# Patient Record
Sex: Male | Born: 1945
Health system: Southern US, Community
[De-identification: ages and names within clinical notes are randomized; demographics above are authoritative.]

## PROBLEM LIST (undated history)

## (undated) DIAGNOSIS — M1990 Unspecified osteoarthritis, unspecified site: Secondary | ICD-10-CM

## (undated) DIAGNOSIS — N471 Phimosis: Secondary | ICD-10-CM

## (undated) HISTORY — PX: APPENDECTOMY: SHX54

---

## 2001-11-28 ENCOUNTER — Emergency Department (HOSPITAL_COMMUNITY): Admission: EM | Admit: 2001-11-28 | Discharge: 2001-11-28 | Payer: Self-pay

## 2001-11-28 ENCOUNTER — Encounter: Payer: Self-pay | Admitting: Emergency Medicine

## 2001-12-31 ENCOUNTER — Emergency Department (HOSPITAL_COMMUNITY): Admission: EM | Admit: 2001-12-31 | Discharge: 2001-12-31 | Payer: Self-pay | Admitting: *Deleted

## 2005-12-26 ENCOUNTER — Ambulatory Visit: Payer: Self-pay | Admitting: Family Medicine

## 2006-01-14 ENCOUNTER — Ambulatory Visit (HOSPITAL_COMMUNITY): Admission: RE | Admit: 2006-01-14 | Discharge: 2006-01-14 | Payer: Self-pay | Admitting: Gastroenterology

## 2007-01-17 ENCOUNTER — Emergency Department (HOSPITAL_COMMUNITY): Admission: EM | Admit: 2007-01-17 | Discharge: 2007-01-17 | Payer: Self-pay | Admitting: Emergency Medicine

## 2015-04-13 ENCOUNTER — Ambulatory Visit: Payer: Self-pay | Admitting: General Surgery

## 2015-04-13 NOTE — H&P (Signed)
History of Present Illness Axel Filler(Jasiri Hanawalt MD; 04/13/2015 9:23 AM) The patient is a 69 year old male who presents with an inguinal hernia. The patient is a 69 year old male who is referred by Dr. Venetia NightAmao Patient comes in today with bilateral inguinal hernia. He states he is unsure of how long these hernias is been there. He does state he notices a bulge the left side however not the right side. He states the pain is worse after being on his feet throughout the day.  Physical Exam Axel Filler(Kasi Lasky MD; 04/13/2015 9:21 AM)  Loraine LericheMark as Reviewed General Mental Status-Alert. General Appearance-Consistent with stated age. Hydration-Well hydrated. Voice-Normal.  Head and Neck Head-normocephalic, atraumatic with no lesions or palpable masses. Trachea-midline. Thyroid Gland Characteristics - normal size and consistency.  Chest and Lung Exam Chest and lung exam reveals -quiet, even and easy respiratory effort with no use of accessory muscles and on auscultation, normal breath sounds, no adventitious sounds and normal vocal resonance. Inspection Chest Wall - Normal. Back - normal.  Cardiovascular Cardiovascular examination reveals -normal heart sounds, regular rate and rhythm with no murmurs and normal pedal pulses bilaterally.  Abdomen Inspection Skin - Scar - no surgical scars. Hernias - Inguinal hernia - Bilateral - Reducible. Palpation/Percussion Normal exam - Soft, Non Tender, No Rebound tenderness, No Rigidity (guarding) and No hepatosplenomegaly. Auscultation Normal exam - Bowel sounds normal.    Assessment & Plan Axel Filler(Anisah Kuck MD; 04/13/2015 9:24 AM)  BILATERAL INGUINAL HERNIA WITHOUT OBSTRUCTION OR GANGRENE, RECURRENCE NOT SPECIFIED (550.92  K40.20) Impression: 69 year old male with bilateral inguinal hernias left greater than right. Patient does have a history of a open appendectomy.  1. The patient will like to proceed to plan for laparoscopic bilateral inguinal  hernia repair with mesh. 2. All risks and benefits were discussed with the patient to generally include, but not limited to: infection, bleeding, damage to surrounding structures, acute and chronic nerve pain, and recurrence. Alternatives were offered and described. All questions were answered and the patient voiced understanding of the procedure and wishes to proceed at this point with hernia repair.

## 2015-05-02 ENCOUNTER — Encounter (HOSPITAL_COMMUNITY): Payer: Self-pay

## 2015-05-02 ENCOUNTER — Encounter (HOSPITAL_COMMUNITY)
Admission: RE | Admit: 2015-05-02 | Discharge: 2015-05-02 | Disposition: A | Discharge status: Home / Self Care | Payer: Medicare Other | Source: Ambulatory Visit | Attending: General Surgery | Admitting: General Surgery

## 2015-05-02 DIAGNOSIS — K4020 Bilateral inguinal hernia, without obstruction or gangrene, not specified as recurrent: Secondary | ICD-10-CM | POA: Diagnosis present

## 2015-05-02 LAB — CBC
HCT: 42.5 % (ref 39.0–52.0)
Hemoglobin: 14.4 g/dL (ref 13.0–17.0)
MCH: 30.1 pg (ref 26.0–34.0)
MCHC: 33.9 g/dL (ref 30.0–36.0)
MCV: 88.9 fL (ref 78.0–100.0)
PLATELETS: 160 10*3/uL (ref 150–400)
RBC: 4.78 MIL/uL (ref 4.22–5.81)
RDW: 12.9 % (ref 11.5–15.5)
WBC: 6.6 10*3/uL (ref 4.0–10.5)

## 2015-05-02 LAB — BASIC METABOLIC PANEL
Anion gap: 8 (ref 5–15)
BUN: 14 mg/dL (ref 6–20)
CALCIUM: 8.8 mg/dL — AB (ref 8.9–10.3)
CHLORIDE: 106 mmol/L (ref 101–111)
CO2: 29 mmol/L (ref 22–32)
CREATININE: 0.71 mg/dL (ref 0.61–1.24)
GFR calc Af Amer: >60 mL/min (ref >60)
GFR calc non Af Amer: >60 mL/min (ref >60)
GLUCOSE: 96 mg/dL (ref 65–99)
Potassium: 4.7 mmol/L (ref 3.5–5.1)
Sodium: 143 mmol/L (ref 135–145)

## 2015-05-02 NOTE — Patient Instructions (Signed)
Jerry [name]  05/02/2015   Your procedure is scheduled on: Friday May 05, 2015   Report to [facility] Main  Entrance and follow signs to               Short Stay Center arrive at 10:30 AM.  Call this number if you have problems the morning of surgery [phone]   Remember: ONLY 1 PERSON MAY GO WITH YOU TO SHORT STAY TO GET  READY MORNING OF YOUR SURGERY.  Do not eat food After Midnight but may take clear liquids till 6:30 am day of surgery then nothing by mouth.      Take these medicines the morning of surgery with A SIP OF WATER: NONE                               You may not have any metal on your body including hair pins and              piercings  Do not wear jewelry, colognes, lotions, powders or deodorant                           Men may shave face and neck.   Do not bring valuables to the hospital.  IS NOT             RESPONSIBLE   FOR VALUABLES.  Contacts, dentures or bridgework may not be worn into surgery.       Patients discharged the day of surgery will not be allowed to drive home.  Name and phone number of your driver:Jerry [name] (sister)   _____________________________________________________________________             [facility] - Preparing for Surgery Before surgery, you can play an important role.  Because skin is not sterile, your skin needs to be as free of germs as possible.  You can reduce the number of germs on your skin by washing with CHG (chlorahexidine gluconate) soap before surgery.  CHG is an antiseptic cleaner which kills germs and bonds with the skin to continue killing germs even after washing. Please DO NOT use if you have an allergy to CHG or antibacterial soaps.  If your skin becomes reddened/irritated stop using the CHG and inform your nurse when you arrive at Short Stay. Do not shave (including legs and underarms) for at least 48 hours prior to the first CHG shower.  You may shave your  face/neck. Please follow these instructions carefully:  1.  Shower with CHG Soap the night before surgery and the  morning of Surgery.  2.  If you choose to wash your hair, wash your hair first as usual with your  normal  shampoo.  3.  After you shampoo, rinse your hair and body thoroughly to remove the  shampoo.                           4.  Use CHG as you would any other liquid soap.  You can apply chg directly  to the skin and wash                       Gently with a scrungie or clean washcloth.  5.  Apply the CHG Soap to your body  ONLY FROM THE NECK DOWN.   Do not use on face/ open                           Wound or open sores. Avoid contact with eyes, ears mouth and genitals (private parts).                       Wash face,  Genitals (private parts) with your normal soap.             6.  Wash thoroughly, paying special attention to the area where your surgery  will be performed.  7.  Thoroughly rinse your body with warm water from the neck down.  8.  DO NOT shower/wash with your normal soap after using and rinsing off  the CHG Soap.                9.  Pat yourself dry with a clean towel.            10.  Wear clean pajamas.            11.  Place clean sheets on your bed the night of your first shower and do not  sleep with pets. Day of Surgery : Do not apply any lotions/deodorants the morning of surgery.  Please wear clean clothes to the hospital/surgery center.  FAILURE TO FOLLOW THESE INSTRUCTIONS MAY RESULT IN THE CANCELLATION OF YOUR SURGERY PATIENT SIGNATURE_________________________________  NURSE SIGNATURE__________________________________  ________________________________________________________________________    CLEAR LIQUID DIET   Foods Allowed                                                                     Foods Excluded  Coffee and tea, regular and decaf                             liquids that you cannot  Plain Jell-O in any flavor                                              see through such as: Fruit ices (not with fruit pulp)                                     milk, soups, orange juice  Iced Popsicles                                    All solid food Carbonated beverages, regular and diet                                    Cranberry, grape and apple juices Sports drinks like Gatorade Lightly seasoned clear broth or consume(fat free) Sugar, honey syrup  Sample Menu Breakfast  Lunch                                     Supper Cranberry juice                    Beef broth                            Chicken broth Jell-O                                     Grape juice                           Apple juice Coffee or tea                        Jell-O                                      Popsicle                                                Coffee or tea                        Coffee or tea  _____________________________________________________________________

## 2015-05-05 ENCOUNTER — Ambulatory Visit (HOSPITAL_COMMUNITY): Payer: Medicare Other | Admitting: Anesthesiology

## 2015-05-05 ENCOUNTER — Ambulatory Visit (HOSPITAL_COMMUNITY)
Admission: RE | Admit: 2015-05-05 | Discharge: 2015-05-05 | Disposition: A | Discharge status: Home / Self Care | Payer: Medicare Other | Source: Ambulatory Visit | Attending: General Surgery | Admitting: General Surgery

## 2015-05-05 ENCOUNTER — Encounter (HOSPITAL_COMMUNITY)
Admission: RE | Disposition: A | Discharge status: Home / Self Care | Payer: Self-pay | Source: Ambulatory Visit | Attending: General Surgery

## 2015-05-05 ENCOUNTER — Encounter (HOSPITAL_COMMUNITY): Payer: Self-pay | Admitting: *Deleted

## 2015-05-05 DIAGNOSIS — K4020 Bilateral inguinal hernia, without obstruction or gangrene, not specified as recurrent: Secondary | ICD-10-CM | POA: Insufficient documentation

## 2015-05-05 HISTORY — PX: INGUINAL HERNIA REPAIR: SHX194

## 2015-05-05 HISTORY — PX: INSERTION OF MESH: SHX5868

## 2015-05-05 SURGERY — REPAIR, HERNIA, INGUINAL, LAPAROSCOPIC
Anesthesia: General | Laterality: Bilateral

## 2015-05-05 MED ORDER — OXYCODONE-ACETAMINOPHEN 5-325 MG PO TABS: 1.0000 | ORAL_TABLET | ORAL | Status: DC | PRN

## 2015-05-05 MED ORDER — STERILE WATER FOR IRRIGATION IR SOLN
Status: DC | PRN
  Administered 2015-05-05: 150 mL via INTRAVESICAL

## 2015-05-05 MED ORDER — CEFAZOLIN SODIUM-DEXTROSE 2-3 GM-% IV SOLR
INTRAVENOUS | Status: AC
  Filled 2015-05-05: qty 50

## 2015-05-05 MED ORDER — LIDOCAINE HCL (CARDIAC) 20 MG/ML IV SOLN
INTRAVENOUS | Status: DC | PRN
  Administered 2015-05-05: 30 mg via INTRAVENOUS

## 2015-05-05 MED ORDER — ACETAMINOPHEN 325 MG PO TABS
650.0000 mg | ORAL_TABLET | ORAL | Status: DC | PRN
Start: 2015-05-05 — End: 2015-05-05
  Administered 2015-05-05: 650 mg via ORAL
  Filled 2015-05-05: qty 2

## 2015-05-05 MED ORDER — CEFAZOLIN SODIUM-DEXTROSE 2-3 GM-% IV SOLR
2.0000 g | INTRAVENOUS | Status: AC
  Administered 2015-05-05: 2 g via INTRAVENOUS

## 2015-05-05 MED ORDER — OXYCODONE HCL 5 MG PO TABS: 5.0000 mg | ORAL_TABLET | ORAL | Status: DC | PRN

## 2015-05-05 MED ORDER — NEOSTIGMINE METHYLSULFATE 10 MG/10ML IV SOLN
INTRAVENOUS | Status: DC | PRN
  Administered 2015-05-05: 2.5 mg via INTRAVENOUS

## 2015-05-05 MED ORDER — GLYCOPYRROLATE 0.2 MG/ML IJ SOLN
INTRAMUSCULAR | Status: DC | PRN
  Administered 2015-05-05: .5 mg via INTRAVENOUS

## 2015-05-05 MED ORDER — ACETAMINOPHEN 650 MG RE SUPP
650.0000 mg | RECTAL | Status: DC | PRN
  Filled 2015-05-05: qty 1

## 2015-05-05 MED ORDER — GLYCOPYRROLATE 0.2 MG/ML IJ SOLN
INTRAMUSCULAR | Status: DC | PRN
Start: 2015-05-05 — End: 2015-05-05

## 2015-05-05 MED ORDER — ROCURONIUM BROMIDE 100 MG/10ML IV SOLN
INTRAVENOUS | Status: DC | PRN
  Administered 2015-05-05: 30 mg via INTRAVENOUS

## 2015-05-05 MED ORDER — ONDANSETRON HCL 4 MG/2ML IJ SOLN
INTRAMUSCULAR | Status: DC | PRN
  Administered 2015-05-05: 4 mg via INTRAVENOUS

## 2015-05-05 MED ORDER — BUPIVACAINE-EPINEPHRINE 0.25% -1:200000 IJ SOLN
INTRAMUSCULAR | Status: DC | PRN
  Administered 2015-05-05: 7 mL

## 2015-05-05 MED ORDER — CHLORHEXIDINE GLUCONATE 4 % EX LIQD: 1.0000 "application " | Freq: Once | CUTANEOUS | Status: DC

## 2015-05-05 MED ORDER — ONDANSETRON HCL 4 MG/2ML IJ SOLN
INTRAMUSCULAR | Status: AC
  Filled 2015-05-05: qty 2

## 2015-05-05 MED ORDER — GLYCOPYRROLATE 0.2 MG/ML IJ SOLN
INTRAMUSCULAR | Status: AC
  Filled 2015-05-05: qty 3

## 2015-05-05 MED ORDER — FENTANYL CITRATE (PF) 250 MCG/5ML IJ SOLN
INTRAMUSCULAR | Status: DC | PRN
  Administered 2015-05-05: 50 ug via INTRAVENOUS
  Administered 2015-05-05: 100 ug via INTRAVENOUS
  Administered 2015-05-05 (×2): 50 ug via INTRAVENOUS

## 2015-05-05 MED ORDER — BUPIVACAINE-EPINEPHRINE (PF) 0.25% -1:200000 IJ SOLN
INTRAMUSCULAR | Status: AC
  Filled 2015-05-05: qty 30

## 2015-05-05 MED ORDER — ROCURONIUM BROMIDE 100 MG/10ML IV SOLN
INTRAVENOUS | Status: AC
Start: 2015-05-05 — End: 2015-05-05
  Filled 2015-05-05: qty 1

## 2015-05-05 MED ORDER — PROPOFOL 10 MG/ML IV BOLUS
INTRAVENOUS | Status: DC | PRN
  Administered 2015-05-05: 150 mg via INTRAVENOUS

## 2015-05-05 MED ORDER — LACTATED RINGERS IV SOLN
INTRAVENOUS | Status: DC
  Administered 2015-05-05: 1000 mL via INTRAVENOUS
  Administered 2015-05-05: 14:00:00 via INTRAVENOUS

## 2015-05-05 MED ORDER — FENTANYL CITRATE (PF) 250 MCG/5ML IJ SOLN
INTRAMUSCULAR | Status: AC
  Filled 2015-05-05: qty 5

## 2015-05-05 MED ORDER — PROPOFOL 10 MG/ML IV BOLUS
INTRAVENOUS | Status: AC
  Filled 2015-05-05: qty 20

## 2015-05-05 MED ORDER — LIDOCAINE HCL (CARDIAC) 20 MG/ML IV SOLN
INTRAVENOUS | Status: AC
  Filled 2015-05-05: qty 5

## 2015-05-05 MED ORDER — NEOSTIGMINE METHYLSULFATE 10 MG/10ML IV SOLN
INTRAVENOUS | Status: AC
  Filled 2015-05-05: qty 1

## 2015-05-05 MED ORDER — SUCCINYLCHOLINE CHLORIDE 20 MG/ML IJ SOLN
INTRAMUSCULAR | Status: DC | PRN
  Administered 2015-05-05: 100 mg via INTRAVENOUS

## 2015-05-05 SURGICAL SUPPLY — 34 items
BAG URINE DRAINAGE (UROLOGICAL SUPPLIES) ×3 IMPLANT
BENZOIN TINCTURE PRP APPL 2/3 (GAUZE/BANDAGES/DRESSINGS) ×3 IMPLANT
CABLE HIGH FREQUENCY MONO STRZ (ELECTRODE) ×3 IMPLANT
CATH FOLEY 3WAY 30CC 16FR (CATHETERS) ×3 IMPLANT
CHLORAPREP W/TINT 26ML (MISCELLANEOUS) ×3 IMPLANT
CLOSURE WOUND 1/2 X4 (GAUZE/BANDAGES/DRESSINGS) ×1
DECANTER SPIKE VIAL GLASS SM (MISCELLANEOUS) IMPLANT
DRAPE LAPAROSCOPIC ABDOMINAL (DRAPES) ×3 IMPLANT
ELECT REM PT RETURN 9FT ADLT (ELECTROSURGICAL) ×3
ELECTRODE REM PT RTRN 9FT ADLT (ELECTROSURGICAL) ×1 IMPLANT
ENDOLOOP SUT PDS II  0 18 (SUTURE) ×2
ENDOLOOP SUT PDS II 0 18 (SUTURE) ×1 IMPLANT
GLOVE BIO SURGEON STRL SZ7.5 (GLOVE) ×3 IMPLANT
GOWN STRL REUS W/TWL XL LVL3 (GOWN DISPOSABLE) ×9 IMPLANT
KIT BASIN OR (CUSTOM PROCEDURE TRAY) ×3 IMPLANT
MESH 3DMAX 4X6 LT LRG (Mesh General) ×3 IMPLANT
MESH 3DMAX 4X6 RT LRG (Mesh General) ×3 IMPLANT
NEEDLE INSUFFLATION 14GA 120MM (NEEDLE) ×3 IMPLANT
PLUG CATH AND CAP STER (CATHETERS) ×3 IMPLANT
RELOAD STAPLE HERNIA 4.0 BLUE (INSTRUMENTS) ×3 IMPLANT
RELOAD STAPLE HERNIA 4.8 BLK (STAPLE) IMPLANT
SCISSORS LAP 5X35 DISP (ENDOMECHANICALS) ×3 IMPLANT
SET IRRIG TUBING LAPAROSCOPIC (IRRIGATION / IRRIGATOR) IMPLANT
SET IRRIG Y TYPE TUR BLADDER L (SET/KITS/TRAYS/PACK) ×3 IMPLANT
STAPLER HERNIA 12 8.5 360D (INSTRUMENTS) ×3 IMPLANT
STRIP CLOSURE SKIN 1/2X4 (GAUZE/BANDAGES/DRESSINGS) ×2 IMPLANT
SUT MNCRL AB 4-0 PS2 18 (SUTURE) ×3 IMPLANT
TAPE CLOTH SURG 4X10 WHT LF (GAUZE/BANDAGES/DRESSINGS) ×3 IMPLANT
TOWEL OR 17X26 10 PK STRL BLUE (TOWEL DISPOSABLE) ×3 IMPLANT
TOWEL OR NON WOVEN STRL DISP B (DISPOSABLE) ×3 IMPLANT
TRAY FOLEY W/METER SILVER 14FR (SET/KITS/TRAYS/PACK) IMPLANT
TRAY LAPAROSCOPIC (CUSTOM PROCEDURE TRAY) ×3 IMPLANT
TROCAR CANNULA W/PORT DUAL 5MM (MISCELLANEOUS) ×3 IMPLANT
TROCAR XCEL 12X100 BLDLESS (ENDOMECHANICALS) ×3 IMPLANT

## 2015-05-05 NOTE — Anesthesia Preprocedure Evaluation (Signed)
Anesthesia Evaluation  Patient identified by MRN, date of birth, ID band Patient awake    Reviewed: Allergy & Precautions, NPO status   History of Anesthesia Complications Negative for: history of anesthetic complications  Airway Mallampati: I  TM Distance: >3 FB Neck ROM: Full    Dental  (+) Edentulous Upper   Pulmonary shortness of breath,  breath sounds clear to auscultation        Cardiovascular negative cardio ROS  Rhythm:Regular Rate:Normal     Neuro/Psych    GI/Hepatic negative GI ROS, Neg liver ROS,   Endo/Other  negative endocrine ROS  Renal/GU negative Renal ROS     Musculoskeletal negative musculoskeletal ROS (+)   Abdominal   Peds  Hematology   Anesthesia Other Findings   Reproductive/Obstetrics                             Anesthesia Physical Anesthesia Plan  ASA: I  Anesthesia Plan: General   Post-op Pain Management:    Induction: Intravenous  Airway Management Planned: Oral ETT  Additional Equipment:   Intra-op Plan:   Post-operative Plan: Extubation in OR  Informed Consent: I have reviewed the patients History and Physical, chart, labs and discussed the procedure including the risks, benefits and alternatives for the proposed anesthesia with the patient or authorized representative who has indicated his/her understanding and acceptance.   Dental advisory given  Plan Discussed with:   Anesthesia Plan Comments:         Anesthesia Quick Evaluation

## 2015-05-05 NOTE — Op Note (Signed)
05/05/2015  1:39 PM  PATIENT:  Jerry [name]  69 y.o. male  PRE-OPERATIVE DIAGNOSIS:  BILATERAL INGUINAL HERNIAS  POST-OPERATIVE DIAGNOSIS:  BILATERAL INGUINAL HERNIAS  PROCEDURE:  Procedure(s): LAPAROSCOPIC BILATERAL INGUINAL HERNIA REPAIRS WITH MESH (Bilateral) INSERTION OF MESH (Bilateral) BILATERAL DIRECTS  SURGEON:  Surgeon(s) and Role:    * Axel Filler, MD - Primary  ANESTHESIA:   local and general  EBL: 5cc Total I/O In: 1000 [I.V.:1000] Out: 110 [Urine:100; Blood:10]  BLOOD ADMINISTERED:none  DRAINS: none   LOCAL MEDICATIONS USED:  BUPIVICAINE   SPECIMEN:  No Specimen  DISPOSITION OF SPECIMEN:  N/A  COUNTS:  YES  TOURNIQUET:  * No tourniquets in log *  DICTATION: .Dragon Dictation Details of the procedure: The patient was taken back to the operating room. The patient was placed in supine position with bilateral SCDs in place.  The patient was prepped and draped in the usual sterile fashion.  After appropriate anitbiotics were confirmed, a time-out was confirmed and all facts were verified.  0.25% Marcaine was used to infiltrate the umbilical area. A 11-blade was used to cut down the skin and blunt dissection was used to get the anterior fashion.  The anterior fascia was incised approximately 1 cm and the muscles were retracted laterally. Blunt dissection was then used to create a space in the preperitoneal area on the right side. At this time a 10 mm camera was then introduced into the space and advanced the pubic tubercle and a 12 mm trocar was placed over this and insufflation was started.    At this time and space was created from medial to laterally the preperitoneal space on the right side.  Cooper's ligament was initially cleaned off.  The hernia sac was identified in the left indirect space. Dissection of the hernia sac was undertaken the vas deferens was identified and protected in all parts of the case.  There was a small tear into the hernia sac. A  Veress needle right upper quadrant to help evacuate the intraperitoneal air.  Once the hernia sac was taken down to approximately the umbilicus, and the cord lipoma reduced, a Bard 3D Max mesh was  introduced into the preperitoneal space.  The mesh was brought over the direct and indirect hernia spaces.  This was anchored into place and secured to Cooper's ligament with 4.70mm staples from a Coviden hernia stapler. It was anchored to the anterior abdominal wall with 4.8 mm staples. The hernia sac was seen lying posterior to the mesh. There was no staples placed laterally.   The exact same dissection took place on the right side.  There was a small indirect hernia and also weakness of the direct space.  The Bard 3D max mesh was placed to cover both the direct and indirect spaces.  This was anchored into place and secured to Cooper's ligament with 4.60mm staples from a Coviden hernia stapler. It was anchored to the anterior abdominal wall with 4.8 mm staples. The hernia sac was seen lying posterior to the mesh. There was no staples placed laterally.   The insufflation was evacuated. The trochars were removed. The anterior fascia was reapproximated using #1 Vicryl on a UR- 6.  Intra-abdominal air was evacuated and the Veress needle removed. The skin was reapproximated using 4-0 Monocryl subcuticular fashion the patient was awakened from general anesthesia and taken to recovery in stable condition.   PLAN OF CARE: Discharge to home after PACU  PATIENT DISPOSITION:  PACU - hemodynamically stable.   Delay  start of Pharmacological VTE agent (>24hrs) due to surgical blood loss or risk of bleeding: not applicable

## 2015-05-05 NOTE — Anesthesia Procedure Notes (Signed)
Procedure Name: Intubation Date/Time: 05/05/2015 12:32 PM Performed by: Izora Gala A Pre-anesthesia Checklist: Emergency Drugs available, Patient identified, Timeout performed, Suction available and Patient being monitored Patient Re-evaluated:Patient Re-evaluated prior to inductionOxygen Delivery Method: Circle system utilized Preoxygenation: Pre-oxygenation with 100% oxygen Ventilation: Mask ventilation without difficulty Laryngoscope Size: Mac and 3 Grade View: Grade I Tube type: Oral Tube size: 7.5 mm Number of attempts: 2 Airway Equipment and Method: Stylet Placement Confirmation: ETT inserted through vocal cords under direct vision,  breath sounds checked- equal and bilateral and positive ETCO2 Secured at: 21 cm Tube secured with: Tape Dental Injury: Teeth and Oropharynx as per pre-operative assessment

## 2015-05-05 NOTE — Discharge Instructions (Signed)
[facility] _______[facility] ° °INGUINAL HERNIA REPAIR: POST OP INSTRUCTIONS ° °Always review your discharge instruction sheet given to you by the facility where your surgery was performed. °IF YOU HAVE DISABILITY OR FAMILY LEAVE FORMS, YOU MUST BRING THEM TO THE OFFICE FOR PROCESSING.   °DO NOT GIVE THEM TO YOUR DOCTOR. ° °1. A  prescription for pain medication may be given to you upon discharge.  Take your pain medication as prescribed, if needed.  If narcotic pain medicine is not needed, then you may take acetaminophen (Tylenol) or ibuprofen (Advil) as needed. °2. Take your usually prescribed medications unless otherwise directed. °3. If you need a refill on your pain medication, please contact your pharmacy.  They will contact our office to request authorization. Prescriptions will not be filled after 5 pm or on week-ends. °4. You should follow a light diet the first 24 hours after arrival home, such as soup and crackers, etc.  Be sure to include lots of fluids daily.  Resume your normal diet the day after surgery. °5. Most patients will experience some swelling and bruising around the umbilicus or in the groin and scrotum.  Ice packs and reclining will help.  Swelling and bruising can take several days to resolve.  °6. It is common to experience some constipation if taking pain medication after surgery.  Increasing fluid intake and taking a stool softener (such as Colace) will usually help or prevent this problem from occurring.  A mild laxative (Milk of Magnesia or Miralax) should be taken according to package directions if there are no bowel movements after 48 hours. °7. Unless discharge instructions indicate otherwise, you may remove your bandages 24-48 hours after surgery, and you may shower at that time.  You may have steri-strips (small skin tapes) in place directly over the incision.  These strips should be left on the skin for 7-10 days.  If your surgeon used skin glue on the incision, you  may shower in 24 hours.  The glue will flake off over the next 2-3 weeks.  Any sutures or staples will be removed at the office during your follow-up visit. °8. ACTIVITIES:  You may resume regular (light) daily activities beginning the next day--such as daily self-care, walking, climbing stairs--gradually increasing activities as tolerated.  You may have sexual intercourse when it is comfortable.  Refrain from any heavy lifting or straining until approved by your doctor. °a. You may drive when you are no longer taking prescription pain medication, you can comfortably wear a seatbelt, and you can safely maneuver your car and apply brakes. °b. RETURN TO WORK:  __________________________________________________________ °9. You should see your doctor in the office for a follow-up appointment approximately 2-3 weeks after your surgery.  Make sure that you call for this appointment within a day or two after you arrive home to insure a convenient appointment time. °10. OTHER INSTRUCTIONS:  __________________________________________________________________________________________________________________________________________________________________________________________  °WHEN TO CALL YOUR DOCTOR: °1. Fever over 101.0 °2. Inability to urinate °3. Nausea and/or vomiting °4. Extreme swelling or bruising °5. Continued bleeding from incision. °6. Increased pain, redness, or drainage from the incision ° °The clinic staff is available to answer your questions during regular business hours.  Please don’t hesitate to call and ask to speak to one of the nurses for clinical concerns.  If you have a medical emergency, go to the nearest emergency room or call 911.  A surgeon from [facility] is always on call at the hospital ° ° °[address], [address], [place], [place]  [address] ? ° [address] [address], [place], [place]   [address] °[phone] ? [phone] ? FAX [phone] °Web site:  [email or web address] ° °

## 2015-05-05 NOTE — Interval H&P Note (Signed)
History and Physical Interval Note:  05/05/2015 10:29 AM  Jerry [name]  has presented today for surgery, with the diagnosis of BILATERAL INGUINAL HERNIAS  The various methods of treatment have been discussed with the patient and family. After consideration of risks, benefits and other options for treatment, the patient has consented to  Procedure(s): LAPAROSCOPIC BILATERAL INGUINAL HERNIA REPAIRS WITH MESH (Bilateral) INSERTION OF MESH (Bilateral) as a surgical intervention .  The patient's history has been reviewed, patient examined, no change in status, stable for surgery.  I have reviewed the patient's chart and labs.  Questions were answered to the patient's satisfaction.     Marigene Ehlers., Jed Limerick

## 2015-05-05 NOTE — Transfer of Care (Signed)
Immediate Anesthesia Transfer of Care Note  Patient: Jerry [name]  Procedure(s) Performed: Procedure(s): LAPAROSCOPIC BILATERAL INGUINAL HERNIA REPAIRS WITH MESH (Bilateral) INSERTION OF MESH (Bilateral)  Patient Location: PACU  Anesthesia Type:General  Level of Consciousness: awake, alert  and oriented  Airway & Oxygen Therapy: Patient Spontanous Breathing and Patient connected to face mask oxygen  Post-op Assessment: Report given to RN and Post -op Vital signs reviewed and stable  Post vital signs: Reviewed and stable  Last Vitals:  Filed Vitals:   05/05/15 1020  BP: 145/85  Pulse: 70  Temp: 36.6 C  Resp: 18    Complications: No apparent anesthesia complications

## 2015-05-05 NOTE — H&P (View-Only) (Signed)
History of Present Illness Jerry Filler MD; 04/13/2015 9:23 AM) The patient is a 69 year old male who presents with an inguinal hernia. The patient is a 70 year old male who is referred by Dr. Venetia Night Patient comes in today with bilateral inguinal hernia. He states he is unsure of how long these hernias is been there. He does state he notices a bulge the left side however not the right side. He states the pain is worse after being on his feet throughout the day.  Physical Exam Jerry Filler MD; 04/13/2015 9:21 AM)  Loraine Leriche as Reviewed General Mental Status-Alert. General Appearance-Consistent with stated age. Hydration-Well hydrated. Voice-Normal.  Head and Neck Head-normocephalic, atraumatic with no lesions or palpable masses. Trachea-midline. Thyroid Gland Characteristics - normal size and consistency.  Chest and Lung Exam Chest and lung exam reveals -quiet, even and easy respiratory effort with no use of accessory muscles and on auscultation, normal breath sounds, no adventitious sounds and normal vocal resonance. Inspection Chest Wall - Normal. Back - normal.  Cardiovascular Cardiovascular examination reveals -normal heart sounds, regular rate and rhythm with no murmurs and normal pedal pulses bilaterally.  Abdomen Inspection Skin - Scar - no surgical scars. Hernias - Inguinal hernia - Bilateral - Reducible. Palpation/Percussion Normal exam - Soft, Non Tender, No Rebound tenderness, No Rigidity (guarding) and No hepatosplenomegaly. Auscultation Normal exam - Bowel sounds normal.    Assessment & Plan Jerry Filler MD; 04/13/2015 9:24 AM)  BILATERAL INGUINAL HERNIA WITHOUT OBSTRUCTION OR GANGRENE, RECURRENCE NOT SPECIFIED (550.92  K40.20) Impression: 69 year old male with bilateral inguinal hernias left greater than right. Patient does have a history of a open appendectomy.  1. The patient will like to proceed to plan for laparoscopic bilateral inguinal  hernia repair with mesh. 2. All risks and benefits were discussed with the patient to generally include, but not limited to: infection, bleeding, damage to surrounding structures, acute and chronic nerve pain, and recurrence. Alternatives were offered and described. All questions were answered and the patient voiced understanding of the procedure and wishes to proceed at this point with hernia repair.

## 2015-05-08 ENCOUNTER — Encounter (HOSPITAL_COMMUNITY): Payer: Self-pay | Admitting: General Surgery

## 2015-05-11 NOTE — Anesthesia Postprocedure Evaluation (Signed)
  Anesthesia Post-op Note  Patient: Jerry [name]  Procedure(s) Performed: Procedure(s): LAPAROSCOPIC BILATERAL INGUINAL HERNIA REPAIRS WITH MESH (Bilateral) INSERTION OF MESH (Bilateral)  Patient Location: PACU  Anesthesia Type:General  Level of Consciousness: awake and alert   Airway and Oxygen Therapy: Patient Spontanous Breathing  Post-op Pain: mild  Post-op Assessment: Post-op Vital signs reviewed              Post-op Vital Signs: stable  Last Vitals:  Filed Vitals:   05/05/15 1541  BP: 145/72  Pulse: 62  Temp:   Resp: 16    Complications: No apparent anesthesia complications

## 2015-09-16 ENCOUNTER — Ambulatory Visit (INDEPENDENT_AMBULATORY_CARE_PROVIDER_SITE_OTHER): Payer: Medicare Other | Admitting: Physician Assistant

## 2015-09-16 VITALS — BP 140/90 | HR 85 | Temp 97.8°F | Resp 18 | Ht 66.0 in | Wt 153.5 lb

## 2015-09-16 DIAGNOSIS — H578 Other specified disorders of eye and adnexa: Secondary | ICD-10-CM

## 2015-09-16 DIAGNOSIS — H5789 Other specified disorders of eye and adnexa: Secondary | ICD-10-CM

## 2015-09-16 NOTE — Progress Notes (Signed)
   Subjective:    Patient ID: Jerry [name], male    DOB: [birth year], 69 y.o.   MRN: [id number]  Chief Complaint  Patient presents with  . Eye Pain    Feels like something in right eye causing some irritation-started around 08/24/15   Medications, allergies, past medical history, surgical history, family history, social history and problem list reviewed and updated.  HPI  [age] yom presents with concerns something is in right eye.   Felt like got something in eye 3 weeks ago. Was not doing anything particular at the time. Has tried to flush it but still has sensation. Over past 1-2 weeks he feels like the sensation is now in the back of his right eye. Denies drainage, denies photophobia. Denies double vision. Feels vision has been a bit blurry past week. Eye has continued to feel scratchy and gritty.   Review of Systems No fevers, chills.     Objective:   Physical Exam  Constitutional: He appears well-developed and well-nourished.  Non-toxic appearance. He does not have a sickly appearance. He does not appear ill. No distress.  BP 140/90 mmHg  Pulse 85  Temp(Src) 97.8 F (36.6 C) (Oral)  Resp 18  Ht 5\' 6"  (1.676 m)  Wt 153 lb 8 oz (69.627 kg)  BMI 24.79 kg/m2  SpO2 98%   Eyes: EOM and lids are normal. Pupils are equal, round, and reactive to light. Lids are everted and swept, no foreign bodies found. Right eye exhibits no discharge. No foreign body present in the right eye. Left eye exhibits no discharge. No foreign body present in the left eye. Right conjunctiva is not injected. Right conjunctiva has no hemorrhage. Left conjunctiva is not injected. Left conjunctiva has no hemorrhage.  Slit lamp exam:      The right eye shows no corneal abrasion, no foreign body, no fluorescein uptake and no anterior chamber bulge.      Assessment & Plan:   Eye irritation --normal vision exam, normal eye exam, normal flourescein exam with no uptake, abrasion, or foreign body identified --eye  flushed vigorously with saline, eye felt better per patient after flushing --options presented to patient including referral to ophthal, seeing his optometrist, or letting us know if foreign body sensation persists, he would like to wait and see and call us if sensation persists  Donnajean Lopesodd M. Breawna Montenegro, PA-C Physician Assistant-Certified [facility]  09/17/2015 1:13 PM

## 2015-09-16 NOTE — Patient Instructions (Signed)
We flushed out your eye today but did not see any foreign bodies or abrasions on the eye.  Hopefully we removed what was bothering you. If you continue to feel the sensation in your eye please let us know and we'll refer you to an eye doctor.

## 2015-09-17 ENCOUNTER — Encounter: Payer: Self-pay | Admitting: Physician Assistant

## 2015-09-30 ENCOUNTER — Telehealth: Payer: Self-pay

## 2015-09-30 DIAGNOSIS — H5789 Other specified disorders of eye and adnexa: Secondary | ICD-10-CM

## 2015-09-30 NOTE — Telephone Encounter (Signed)
Assessment & Plan:   Eye irritation --normal vision exam, normal eye exam, normal flourescein exam with no uptake, abrasion, or foreign body identified --eye flushed vigorously with saline, eye felt better per patient after flushing --options presented to patient including referral to ophthal, seeing his optometrist, or letting us know if foreign body sensation persists, he would like to wait and see and call us if sensation persists  Donnajean Lopesodd M. McVeigh, PA-C       Referral placed.

## 2015-09-30 NOTE — Telephone Encounter (Signed)
Patient's sister Jerry ForestShirley states he was seen in October for his eye. They were told to call back if no improvement for an eye referral. Referral mention at the end of Todd's note. Can they get a referral? Still feels like something is in the eye. Call back# [phone] (Shirley's cell).

## 2016-01-11 ENCOUNTER — Ambulatory Visit (INDEPENDENT_AMBULATORY_CARE_PROVIDER_SITE_OTHER): Payer: Medicare Other | Admitting: Physician Assistant

## 2016-01-11 VITALS — BP 130/80 | HR 58 | Temp 98.6°F | Resp 18 | Ht 68.25 in | Wt 154.2 lb

## 2016-01-11 DIAGNOSIS — L02512 Cutaneous abscess of left hand: Secondary | ICD-10-CM | POA: Diagnosis not present

## 2016-01-11 DIAGNOSIS — Z23 Encounter for immunization: Secondary | ICD-10-CM | POA: Diagnosis not present

## 2016-01-11 DIAGNOSIS — L0390 Cellulitis, unspecified: Secondary | ICD-10-CM

## 2016-01-11 DIAGNOSIS — L0291 Cutaneous abscess, unspecified: Secondary | ICD-10-CM

## 2016-01-11 MED ORDER — CEFTRIAXONE SODIUM 1 G IJ SOLR
1.0000 g | Freq: Once | INTRAMUSCULAR | Status: AC
  Administered 2016-01-11: 1 g via INTRAMUSCULAR

## 2016-01-11 MED ORDER — DOXYCYCLINE HYCLATE 100 MG PO CAPS: 100.0000 mg | ORAL_CAPSULE | Freq: Two times a day (BID) | ORAL | Status: AC

## 2016-01-11 NOTE — Progress Notes (Signed)
[facility] [address] [place] [address] [phone]  Date:  01/11/2016   Name:  Abdulah Iqbal   DOB:  [birth year]   MRN:  [id number]  PCP:  Pcp Not In System    History of Present Illness:  Kenya Shiraishi is a 70 y.o. male patient who presents to [facility] for cc of finger swollen.    Patient reports 1 week of a bump on his finger.  This has increased in swelling ovre these days, with swelling starting along the entire hand.  He has some pus when after he placed a pin over it.  He denies fever, nausea, or dizziness.  He does not recall his last tdap.  He does work with Engineer, production at various locations.       There are no active problems to display for this patient.   Past Medical History  Diagnosis Date  . Shortness of breath dyspnea     with humidity     Past Surgical History  Procedure Laterality Date  . Appendectomy    . Inguinal hernia repair Bilateral 05/05/2015    Procedure: LAPAROSCOPIC BILATERAL INGUINAL HERNIA REPAIRS WITH MESH;  Surgeon: Axel Filler, MD;  Location: [facility];  Service: General;  Laterality: Bilateral;  . Insertion of mesh Bilateral 05/05/2015    Procedure: INSERTION OF MESH;  Surgeon: Axel Filler, MD;  Location: [facility];  Service: General;  Laterality: Bilateral;    Social History  Substance Use Topics  . Smoking status: Never Smoker   . Smokeless tobacco: Never Used  . Alcohol Use: No    History reviewed. No pertinent family history.  No Known Allergies  Medication list has been reviewed and updated.  No current outpatient prescriptions on file prior to visit.   No current facility-administered medications on file prior to visit.    ROS ROS otherwise unremarkable unless listed above.   Physical Examination: BP 130/80 mmHg  Pulse 58  Temp(Src) 98.6 F (37 C) (Oral)  Resp 18  Ht 5' 8.25" (1.734 m)  Wt 154 lb 3.2 oz (69.945 kg)  BMI 23.26 kg/m2  SpO2 99% Ideal Body Weight: Weight in (lb) to have BMI =  25: 165.3  Physical Exam  Constitutional: He is oriented to person, place, and time. He appears well-developed and well-nourished. No distress.  His clothes are soiled and hygeine appears lacking at this time.    HENT:  Head: Normocephalic and atraumatic.  Eyes: Conjunctivae and EOM are normal. Pupils are equal, round, and reactive to light.  Cardiovascular: Normal rate.   Pulmonary/Chest: Effort normal. No respiratory distress.  Neurological: He is alert and oriented to person, place, and time.  Skin: Skin is warm and dry. He is not diaphoretic.  Left 4th finger at proximal phalangeal with superficial swelling and redness with purulent drainage present.  There is swelling along the entirety of the the hand, however no erythema or lymphangitis present.  Normal resisted strength.  Flexion is decreased due to pain.  Sensation intact.   Psychiatric: He has a normal mood and affect. His behavior is normal.   Procedure: verbal consent obtained.  Alcohol swab 4th finger mcp for block.  .5% marcaine used.  1% lidocaine placed at the wound site. Cleansed with povidine swabs.  11 blade utilized to place 1 cm shallow laceration.  Purulent fluid and necrotic fluid expressed.  Necrotic tissue removed, and darkly purplish tissue present.  1/4 minimal packed.  Dressings applied.    Assessment and  Plan: Briton Sellman is a 70 y.o. male who is here today for cc of left 4th finger pain. Abscess cleaned out.  This does appear to be mrsa, and we will culture at this time Rocephin shot given, and starting doxycycline at this time.  tdap update.  i have advised that he return in 48 hours, unless sxs arise that warrant more immediate concern as discussed.   Placing referral to hand surgeon.  I am concerned of patient's return, though he states that he will attempt to return here.  He will be transported by sister from his home, he reports.   Abscess of finger of left hand - Plan: cefTRIAXone (ROCEPHIN) injection 1  g, Ambulatory referral to Hand Surgery, Tdap vaccine greater than or equal to 7yo IM, doxycycline (VIBRAMYCIN) 100 MG capsule  Cellulitis and abscess - Plan: cefTRIAXone (ROCEPHIN) injection 1 g, Ambulatory referral to Hand Surgery, Tdap vaccine greater than or equal to 7yo IM, doxycycline (VIBRAMYCIN) 100 MG capsule  Need for tetanus booster - Plan: Tdap vaccine greater than or equal to 7yo IM  Trena Platt, PA-C [facility] 2/23/201710:54 AM

## 2016-01-11 NOTE — Patient Instructions (Addendum)
I need to see you back in 2 days for check up.   Please take the antibiotic as we prescribed.   I would keep this wound covered.  If you get the wound dressing wet or dirty, you need to change the top dressing, keeping the packing in place.   Please take the doxycycline twice per day.  This is the antibiotic to help get rid of the infection.  Take the antibiotic with food.   I have scheduled a referral with the hand doctor.  They will contact you with an appointment.  In the meantime, we need to see you here in 2 days.   You can take the tylenol for pain.   Incision and Drainage, Care After Refer to this sheet in the next few weeks. These instructions provide you with information on caring for yourself after your procedure. Your caregiver may also give you more specific instructions. Your treatment has been planned according to current medical practices, but problems sometimes occur. Call your caregiver if you have any problems or questions after your procedure. HOME CARE INSTRUCTIONS   If antibiotic medicine is given, take it as directed. Finish it even if you start to feel better.  Only take over-the-counter or prescription medicines for pain, discomfort, or fever as directed by your caregiver.  Keep all follow-up appointments as directed by your caregiver.  Change any bandages (dressings) as directed by your caregiver. Replace old dressings with clean dressings.  Wash your hands before and after caring for your wound. You will receive specific instructions for cleansing and caring for your wound.  SEEK MEDICAL CARE IF:   You have increased pain, swelling, or redness around the wound.  You have increased drainage, smell, or bleeding from the wound.  You have muscle aches, chills, or you feel generally sick.  You have a fever. MAKE SURE YOU:   Understand these instructions.  Will watch your condition.  Will get help right away if you are not doing well or get worse.   This  information is not intended to replace advice given to you by your health care provider. Make sure you discuss any questions you have with your health care provider.   Document Released: 01/27/2012 Document Revised: 11/25/2014 Document Reviewed: 01/27/2012 Elsevier Interactive Patient Education 2016 [employer].  Doxycycline tablets or capsules What is this medicine? DOXYCYCLINE (dox i SYE kleen) is a tetracycline antibiotic. It kills certain bacteria or stops their growth. It is used to treat many kinds of infections, like dental, skin, respiratory, and urinary tract infections. It also treats acne, Lyme disease, malaria, and certain sexually transmitted infections. This medicine may be used for other purposes; ask your health care provider or pharmacist if you have questions. What should I tell my health care provider before I take this medicine? They need to know if you have any of these conditions: -liver disease -long exposure to sunlight like working outdoors -stomach problems like colitis -an unusual or allergic reaction to doxycycline, tetracycline antibiotics, other medicines, foods, dyes, or preservatives -pregnant or trying to get pregnant -breast-feeding How should I use this medicine? Take this medicine by mouth with a full glass of water. Follow the directions on the prescription label. It is best to take this medicine without food, but if it upsets your stomach take it with food. Take your medicine at regular intervals. Do not take your medicine more often than directed. Take all of your medicine as directed even if you think you are better.  Do not skip doses or stop your medicine early. Talk to your pediatrician regarding the use of this medicine in children. While this drug may be prescribed for selected conditions, precautions do apply. Overdosage: If you think you have taken too much of this medicine contact a poison control center or emergency room at once. NOTE: This  medicine is only for you. Do not share this medicine with others. What if I miss a dose? If you miss a dose, take it as soon as you can. If it is almost time for your next dose, take only that dose. Do not take double or extra doses. What may interact with this medicine? -antacids -barbiturates -birth control pills -bismuth subsalicylate -carbamazepine -methoxyflurane -other antibiotics -phenytoin -vitamins that contain iron -warfarin This list may not describe all possible interactions. Give your health care provider a list of all the medicines, herbs, non-prescription drugs, or dietary supplements you use. Also tell them if you smoke, drink alcohol, or use illegal drugs. Some items may interact with your medicine. What should I watch for while using this medicine? Tell your doctor or health care professional if your symptoms do not improve. Do not treat diarrhea with over the counter products. Contact your doctor if you have diarrhea that lasts more than 2 days or if it is severe and watery. Do not take this medicine just before going to bed. It may not dissolve properly when you lay down and can cause pain in your throat. Drink plenty of fluids while taking this medicine to also help reduce irritation in your throat. This medicine can make you more sensitive to the sun. Keep out of the sun. If you cannot avoid being in the sun, wear protective clothing and use sunscreen. Do not use sun lamps or tanning beds/booths. Birth control pills may not work properly while you are taking this medicine. Talk to your doctor about using an extra method of birth control. If you are being treated for a sexually transmitted infection, avoid sexual contact until you have finished your treatment. Your sexual partner may also need treatment. Avoid antacids, aluminum, calcium, magnesium, and iron products for 4 hours before and 2 hours after taking a dose of this medicine. If you are using this medicine to  prevent malaria, you should still protect yourself from contact with mosquitos. Stay in screened-in areas, use mosquito nets, keep your body covered, and use an insect repellent. What side effects may I notice from receiving this medicine? Side effects that you should report to your doctor or health care professional as soon as possible: -allergic reactions like skin rash, itching or hives, swelling of the face, lips, or tongue -difficulty breathing -fever -itching in the rectal or genital area -pain on swallowing -redness, blistering, peeling or loosening of the skin, including inside the mouth -severe stomach pain or cramps -unusual bleeding or bruising -unusually weak or tired -yellowing of the eyes or skin Side effects that usually do not require medical attention (report to your doctor or health care professional if they continue or are bothersome): -diarrhea -loss of appetite -nausea, vomiting This list may not describe all possible side effects. Call your doctor for medical advice about side effects. You may report side effects to FDA at 1-800-FDA-1088. Where should I keep my medicine? Keep out of the reach of children. Store at room temperature, below 30 degrees C (86 degrees F). Protect from light. Keep container tightly closed. Throw away any unused medicine after the expiration date. Taking this medicine  after the expiration date can make you seriously ill. NOTE: This sheet is a summary. It may not cover all possible information. If you have questions about this medicine, talk to your doctor, pharmacist, or health care provider.    2016, Elsevier/Gold Standard. (2015-02-24 12:10:28)

## 2016-01-13 ENCOUNTER — Ambulatory Visit (INDEPENDENT_AMBULATORY_CARE_PROVIDER_SITE_OTHER): Payer: Medicare Other | Admitting: Urgent Care

## 2016-01-13 ENCOUNTER — Ambulatory Visit (INDEPENDENT_AMBULATORY_CARE_PROVIDER_SITE_OTHER): Payer: Medicare Other

## 2016-01-13 VITALS — BP 130/70 | HR 70 | Temp 98.3°F | Resp 16 | Ht 68.0 in | Wt 152.6 lb

## 2016-01-13 DIAGNOSIS — M79645 Pain in left finger(s): Secondary | ICD-10-CM

## 2016-01-13 DIAGNOSIS — L02512 Cutaneous abscess of left hand: Secondary | ICD-10-CM

## 2016-01-13 DIAGNOSIS — A4902 Methicillin resistant Staphylococcus aureus infection, unspecified site: Secondary | ICD-10-CM

## 2016-01-13 LAB — POCT CBC
GRANULOCYTE PERCENT: 68.8 % (ref 37–80)
HCT, POC: 44 % (ref 43.5–53.7)
HEMOGLOBIN: 15.5 g/dL (ref 14.1–18.1)
Lymph, poc: 1.8 (ref 0.6–3.4)
MCH: 30.8 pg (ref 27–31.2)
MCHC: 35.1 g/dL (ref 31.8–35.4)
MCV: 87.6 fL (ref 80–97)
MID (cbc): 0.5 (ref 0–0.9)
MPV: 6.4 fL (ref 0–99.8)
POC Granulocyte: 5 (ref 2–6.9)
POC LYMPH PERCENT: 25 %L (ref 10–50)
POC MID %: 6.2 %M (ref 0–12)
Platelet Count, POC: 213 10*3/uL (ref 142–424)
RBC: 5.03 M/uL (ref 4.69–6.13)
RDW, POC: 12.9 %
WBC: 7.3 10*3/uL (ref 4.6–10.2)

## 2016-01-13 LAB — WOUND CULTURE
Gram Stain: NONE SEEN
Gram Stain: NONE SEEN

## 2016-01-13 NOTE — Patient Instructions (Signed)
MRSA Infection, Adult MRSA stands for methicillin-resistant Staphylococcus aureus. This type of infection is caused by Staphylococcus aureus bacteria that are no longer affected by the medicines used to kill them (drug resistant). Staphylococcus (staph) bacteria are normally found on the skin or in the nose of healthy people. In most cases, these bacteria do not cause infection. But if these resistant bacteria enter your body through a cut or sore, they can cause a serious infection on your skin or in other parts of your body. There is a slight chance that the staph on your skin or in your nose is MRSA. There are two types of MRSA infections:  Hospital-acquired MRSA is bacteria that you get in the hospital.  Community-acquired MRSA is bacteria that you get somewhere other than in a hospital. RISK FACTORS Hospital-acquired MRSA is more common. You could be at risk for this infection if you are in the hospital and you:  Have surgery or a procedure.  Have an IV access or a catheter tube placed in your body.  Have weak resistance to germs (weakened immune system).  Are elderly.  Are on kidney dialysis. You could be at risk for community-acquired MRSA if you have a break in your skin and come into contact with MRSA. This may happen if you:  Play sports where there is skin-to-skin contact.  Live in a crowded setting, like a dormitory or a military barracks.  Share towels, razors, or sports equipment with other people. SYMPTOMS  Symptoms of hospital-acquired MRSA depend on where MRSA has spread. Symptoms may include:  Wound infection.  Skin infection.  Rash.  Pneumonia.  Fever and chills.  Difficulty breathing.  Chest pain. Community-acquired MRSA is most likely to start as a scratch or cut that becomes infected. Symptoms may include:  A pus-filled pimple.  A boil on your skin.  Pus draining from your skin.  A sore (abscess) under your skin or somewhere in your  body.  Fever with or without chills. DIAGNOSIS  The diagnosis of MRSA is made by taking a sample from an infected area and sending it to a lab for testing. A lab technician can grow (culture) MRSA and check it under a microscope. The cultured MRSA can be tested to see which type of antibiotic medicine will work to treat it. Newer tests can identify MRSA more quickly by testing bacteria samples for MRSA genes. Your health care provider can diagnose MRSA using samples from:   Cuts or wounds in infected areas.  Nasal swabs.  Saliva or cough specimens from deep in the lungs (sputum).  Urine.  Blood. You may also have:  Imaging studies (such as X-ray or MRI) to check if the infection has spread to the lungs, bones, or joints.  A culture and sensitivity test of blood or fluids from inside the joints. TREATMENT  Treatment depends on how severe, deep, or extensive the infection is. Very bad infections may require a hospital stay.  Some skin infections, such as a small boil or sore (abscess), may be treated by draining pus from the site of the infection.  More extensive surgery to drain pus may be necessary for deeper or more widespread soft tissue infections.  You may then have to take antibiotic medicine given by mouth or through a vein. You may start antibiotic treatment right away or after testing can be done to see what antibiotic medicine should be used. HOME CARE INSTRUCTIONS   Take your antibiotics as directed by your health care provider.   Take the medicine as prescribed until it is finished.  Avoid close contact with those around you as much as possible. Do not use towels, razors, toothbrushes, bedding, or other items that will be used by others.  Wash your hands frequently for 15 seconds with soap and water. Dry your hands with a clean or disposable towel.  When you are not able to wash your hands, use hand sanitizer that is more than 60 percent alcohol.  Wash towels, sheets,  or clothes in the washing machine with detergent and hot water. Dry them in a hot dryer.  Follow your health care provider's instructions for wound care. Wash your hands before and after changing your bandages.  Always shower after exercising.  Keep all cuts and scrapes clean and covered with a bandage.  Be sure to tell all your health care providers that you have MRSA so they are aware of your infection. SEEK MEDICAL CARE IF:  You have a cut, scrape, pimple, or boil that becomes red, swollen, or painful or has pus in it.  You have pus draining from your skin.  You have an abscess under your skin or somewhere in your body. SEEK IMMEDIATE MEDICAL CARE IF:   You have symptoms of a skin infection with a fever or chills.  You have trouble breathing.  You have chest pain.  You have a skin wound and you become nauseous or start vomiting. MAKE SURE YOU:  Understand these instructions.  Will watch your condition.  Will get help right away if you are not doing well or get worse.   This information is not intended to replace advice given to you by your health care provider. Make sure you discuss any questions you have with your health care provider.   Document Released: 11/04/2005 Document Revised: 03/21/2015 Document Reviewed: 08/27/2013 Elsevier Interactive Patient Education 2016 Elsevier Inc.  

## 2016-01-13 NOTE — Progress Notes (Signed)
MRN: [id number] DOB: 1946-01-12  Subjective:   Jerry [name] is a 70 y.o. male presenting for Wound Check  Patient was initially seen on 01/13/2016, treated for finger abscess, given IM ceftriaxone and started on doxycycline, finger placed in a splint. Wound culture was positive for MRSA infection. Today, patient reports that his finger feels better. Both pain and swelling have improved significantly. He denies redness, fever, bony pain, streaking from his finger into hand or forearm.  Jerry [name] has a current medication list which includes the following prescription(s): doxycycline. Also has No Known Allergies.  Jerry [name]  has a past medical history of Shortness of breath dyspnea. Also  has past surgical history that includes Appendectomy; Inguinal hernia repair (Bilateral, 05/05/2015); and Insertion of mesh (Bilateral, 05/05/2015).  Objective:   Vitals: BP 130/70 mmHg  Pulse 70  Temp(Src) 98.3 F (36.8 C) (Oral)  Resp 16  Ht  (1.727 m)  Wt 152 lb 9.6 oz (69.219 kg)  BMI 23.21 kg/m2  SpO2 96%  Physical Exam  Constitutional: He is oriented to person, place, and time. He appears well-developed and well-nourished.  Cardiovascular: Normal rate.   Pulmonary/Chest: Effort normal.  Musculoskeletal:       Hands: Neurological: He is alert and oriented to person, place, and time.  Skin: Skin is warm and dry.   Wound Care: Dressing removed, ~1cm packing removed. Wound irrigated with ~20cc sterile water. Wound was dressed with non-adherent dressing, placed in splint and secured with Coband.  Results for orders placed or performed in visit on 01/13/16 (from the past 24 hour(s))  POCT CBC     Status: None   Collection Time: 01/13/16  9:24 AM  Result Value Ref Range   WBC 7.3 4.6 - 10.2 K/uL   Lymph, poc 1.8 0.6 - 3.4   POC LYMPH PERCENT 25.0 10 - 50 %L   MID (cbc) 0.5 0 - 0.9   POC MID % 6.2 0 - 12 %M   POC Granulocyte 5.0 2 - 6.9   Granulocyte percent 68.8 37 - 80 %G   RBC 5.03  4.69 - 6.13 M/uL   Hemoglobin 15.5 14.1 - 18.1 g/dL   HCT, POC 21.3 08.6 - 53.7 %   MCV 87.6 80 - 97 fL   MCH, POC 30.8 27 - 31.2 pg   MCHC 35.1 31.8 - 35.4 g/dL   RDW, POC 57.8 %   Platelet Count, POC 213 142 - 424 K/uL   MPV 6.4 0 - 99.8 fL    Dg Finger Index Left  01/13/2016  CLINICAL DATA:  Left index finger pain and swelling. EXAM: LEFT INDEX FINGER 2+V COMPARISON:  None. FINDINGS: Distal soft tissue swelling is noted. A 1 mm metallic foreign body in the soft tissues anterior to the index finger tuft is noted. Degenerative changes at the DIP joint noted. There is no evidence of fracture, subluxation or dislocation. IMPRESSION: Soft-tissue swelling with 1 mm metallic foreign body in the soft tissues of the distal index finger. No acute bony abnormality. Electronically Signed   By: Harmon Pier M.D.   On: 01/13/2016 09:23   Assessment and Plan :   1. Abscess of finger of left hand 2. Pain of finger of left hand 3. MRSA infection - Improved, patient agreed to rtc tomorrow for wound care. Regarding foreign body as seen in x-ray, patient does not recall any injury to his finger. He does not have any tenderness as distal finger on exam. Monitor.  Wallis Bamberg, PA-C  [facility] [phone] 01/13/2016 8:42 AM

## 2016-01-14 ENCOUNTER — Ambulatory Visit (INDEPENDENT_AMBULATORY_CARE_PROVIDER_SITE_OTHER): Payer: Medicare Other | Admitting: Urgent Care

## 2016-01-14 VITALS — BP 160/88 | HR 60 | Temp 98.2°F | Resp 18

## 2016-01-14 DIAGNOSIS — A4902 Methicillin resistant Staphylococcus aureus infection, unspecified site: Secondary | ICD-10-CM

## 2016-01-14 DIAGNOSIS — L02512 Cutaneous abscess of left hand: Secondary | ICD-10-CM

## 2016-01-14 NOTE — Progress Notes (Signed)
    MRN: [id number] DOB: 09/10/1946  Subjective:   Jerry [name] is a 70 y.o. male presenting for follow up on left index finger abscess. Patient reports improvement. Decreased swelling, tenderness. He presents today for wound recheck.  Jerry [name] has a current medication list which includes the following prescription(s): doxycycline. Also has No Known Allergies.  Jerry [name]  has a past medical history of Shortness of breath dyspnea. Also  has past surgical history that includes Appendectomy; Inguinal hernia repair (Bilateral, 05/05/2015); and Insertion of mesh (Bilateral, 05/05/2015).  Objective:   Vitals: BP 160/88 mmHg  Pulse 60  Temp(Src) 98.2 F (36.8 C) (Oral)  Resp 18  SpO2 98%  Physical Exam  Constitutional: He is oriented to person, place, and time. He appears well-developed and well-nourished.  Cardiovascular: Normal rate.   Pulmonary/Chest: Effort normal.  Musculoskeletal:       Left hand: He exhibits tenderness (with irrigation only). He exhibits normal range of motion, no bony tenderness, normal capillary refill, no laceration and no swelling. Normal sensation noted. Normal strength noted.       Hands: Neurological: He is alert and oriented to person, place, and time.  Skin: Skin is warm and dry.    Results for orders placed or performed in visit on 01/13/16 (from the past 24 hour(s))  POCT CBC     Status: None   Collection Time: 01/13/16  9:24 AM  Result Value Ref Range   WBC 7.3 4.6 - 10.2 K/uL   Lymph, poc 1.8 0.6 - 3.4   POC LYMPH PERCENT 25.0 10 - 50 %L   MID (cbc) 0.5 0 - 0.9   POC MID % 6.2 0 - 12 %M   POC Granulocyte 5.0 2 - 6.9   Granulocyte percent 68.8 37 - 80 %G   RBC 5.03 4.69 - 6.13 M/uL   Hemoglobin 15.5 14.1 - 18.1 g/dL   HCT, POC 40.9 81.1 - 53.7 %   MCV 87.6 80 - 97 fL   MCH, POC 30.8 27 - 31.2 pg   MCHC 35.1 31.8 - 35.4 g/dL   RDW, POC 91.4 %   Platelet Count, POC 213 142 - 424 K/uL   MPV 6.4 0 - 99.8 fL   Wound Care - Dressing was removed and  finger splint discarded. Wound was irrigated with ~20cc of sterile water. Finger splint placed again, cleansed and dressed.  Assessment and Plan :   1. Abscess of finger of left hand 2. MRSA infection - Improving, reviewed wound care with patient's sister who will help patient change dressing at home. She reluctantly agreed. Patient will return to clinic in 2 days for wound recheck.  Wallis Bamberg, PA-C [facility] [phone] 01/14/2016 9:19 AM

## 2016-01-16 ENCOUNTER — Ambulatory Visit (INDEPENDENT_AMBULATORY_CARE_PROVIDER_SITE_OTHER): Payer: Medicare Other | Admitting: Urgent Care

## 2016-01-16 VITALS — BP 157/95 | HR 82 | Temp 98.3°F | Resp 16 | Ht 68.0 in | Wt 151.6 lb

## 2016-01-16 DIAGNOSIS — R030 Elevated blood-pressure reading, without diagnosis of hypertension: Secondary | ICD-10-CM

## 2016-01-16 DIAGNOSIS — L02512 Cutaneous abscess of left hand: Secondary | ICD-10-CM

## 2016-01-16 NOTE — Progress Notes (Signed)
    MRN: [id number] DOB: Feb 02, 1946  Subjective:   Jerry [name] is a 70 y.o. male presenting for follow up on left finger abscess. Patient has changed his dressing daily since his last visit. Reports improvement in finger pain and swelling. Denies fever, redness, decreased ROM, numbness and tingling.   Jerry [name] has a current medication list which includes the following prescription(s): doxycycline. Also has No Known Allergies.  Jerry [name]  has a past medical history of Shortness of breath dyspnea. Also  has past surgical history that includes Appendectomy; Inguinal hernia repair (Bilateral, 05/05/2015); and Insertion of mesh (Bilateral, 05/05/2015).  Objective:   Vitals: BP 157/95 mmHg  Pulse 82  Temp(Src) 98.3 F (36.8 C) (Oral)  Resp 16  Ht  (1.727 m)  Wt 151 lb 9.6 oz (68.765 kg)  BMI 23.06 kg/m2  SpO2 98%  BP Readings from Last 3 Encounters:  01/16/16 157/95  01/14/16 160/88  01/13/16 130/70   Physical Exam  Constitutional: He is oriented to person, place, and time. He appears well-developed and well-nourished.  Cardiovascular: Normal rate.   Pulmonary/Chest: Effort normal.  Musculoskeletal:       Left hand: He exhibits tenderness (during irrigation of wound only). He exhibits normal range of motion, no bony tenderness, normal capillary refill and no swelling. Normal sensation noted. Normal strength noted.       Hands: Neurological: He is alert and oriented to person, place, and time.  Skin: Skin is warm and dry.   WOUND CARE: Dressing removed. Rinsed with ~10cc of sterile water. Non-viable tissue debrided gently with gauze. Cleansed and dressed without splint.  Assessment and Plan :   1. Abscess of finger of left hand - Significantly improved. Healing very well. Patient is to continue daily dressing changes. Transportation is difficult for patient so he is to return to our clinic the day he finishes his antibiotic therapy.  2. Elevated blood pressure reading without  diagnosis of hypertension - Recheck at next visit, consider starting BP   Jerry Bamberg, PA-C [facility] [phone] 01/16/2016 9:14 AM

## 2016-01-16 NOTE — Patient Instructions (Addendum)
Please keep changing your dressing daily. Return to our clinic the day that you finish taking your antibiotic.   Abscess An abscess is an infected area that contains a collection of pus and debris.It can occur in almost any part of the body. An abscess is also known as a furuncle or boil. CAUSES  An abscess occurs when tissue gets infected. This can occur from blockage of oil or sweat glands, infection of hair follicles, or a minor injury to the skin. As the body tries to fight the infection, pus collects in the area and creates pressure under the skin. This pressure causes pain. People with weakened immune systems have difficulty fighting infections and get certain abscesses more often.  SYMPTOMS Usually an abscess develops on the skin and becomes a painful mass that is red, warm, and tender. If the abscess forms under the skin, you may feel a moveable soft area under the skin. Some abscesses break open (rupture) on their own, but most will continue to get worse without care. The infection can spread deeper into the body and eventually into the bloodstream, causing you to feel ill.  DIAGNOSIS  Your caregiver will take your medical history and perform a physical exam. A sample of fluid may also be taken from the abscess to determine what is causing your infection. TREATMENT  Your caregiver may prescribe antibiotic medicines to fight the infection. However, taking antibiotics alone usually does not cure an abscess. Your caregiver may need to make a small cut (incision) in the abscess to drain the pus. In some cases, gauze is packed into the abscess to reduce pain and to continue draining the area. HOME CARE INSTRUCTIONS   Only take over-the-counter or prescription medicines for pain, discomfort, or fever as directed by your caregiver.  If you were prescribed antibiotics, take them as directed. Finish them even if you start to feel better.  If gauze is used, follow your caregiver's directions for  changing the gauze.  To avoid spreading the infection:  Keep your draining abscess covered with a bandage.  Wash your hands well.  Do not share personal care items, towels, or whirlpools with others.  Avoid skin contact with others.  Keep your skin and clothes clean around the abscess.  Keep all follow-up appointments as directed by your caregiver. SEEK MEDICAL CARE IF:   You have increased pain, swelling, redness, fluid drainage, or bleeding.  You have muscle aches, chills, or a general ill feeling.  You have a fever. MAKE SURE YOU:   Understand these instructions.  Will watch your condition.  Will get help right away if you are not doing well or get worse.   This information is not intended to replace advice given to you by your health care provider. Make sure you discuss any questions you have with your health care provider.   Document Released: 08/14/2005 Document Revised: 05/05/2012 Document Reviewed: 01/17/2012 Elsevier Interactive Patient Education [employer]

## 2016-01-20 ENCOUNTER — Ambulatory Visit (INDEPENDENT_AMBULATORY_CARE_PROVIDER_SITE_OTHER): Payer: Medicare Other | Admitting: Urgent Care

## 2016-01-20 VITALS — BP 138/72 | HR 72 | Temp 98.0°F | Resp 16 | Wt 152.0 lb

## 2016-01-20 DIAGNOSIS — M79645 Pain in left finger(s): Secondary | ICD-10-CM

## 2016-01-20 DIAGNOSIS — L02512 Cutaneous abscess of left hand: Secondary | ICD-10-CM

## 2016-01-20 NOTE — Progress Notes (Signed)
    MRN: [id number] DOB: 11/28/1945  Subjective:   Jerry [name] is a 70 y.o. male presenting for follow up on left index finger abscess. Reports significant improvement in his finger pain, swelling. He has 1 dose left of doxycycline. Patient has been changing dressing daily. He was seen by ortho and no further management was needed.   Geovanny has a current medication list which includes the following prescription(s): doxycycline. Also has No Known Allergies.  August SaucerBennie  has a past medical history of Shortness of breath dyspnea. Also  has past surgical history that includes Appendectomy; Inguinal hernia repair (Bilateral, 05/05/2015); and Insertion of mesh (Bilateral, 05/05/2015).  Objective:   Vitals: BP 138/72 mmHg  Pulse 72  Temp(Src) 98 F (36.7 C) (Oral)  Resp 16  Wt 152 lb (68.947 kg)  SpO2 98%  Physical Exam  Constitutional: He is oriented to person, place, and time. He appears well-developed.  Cardiovascular: Normal rate.   Pulmonary/Chest: Effort normal.  Neurological: He is alert and oriented to person, place, and time.   Left finger with <0.5cm superficial open wound with surrounding granulated tissue. No drainage of pus or bleeding.  Wound care - dressing removed, cleansed with sterile water and redressed.  Assessment and Plan :   1. Abscess of finger of left hand 2. Pain of finger of left hand - Anticipatory guidance provided. No need for further follow up.   Wallis BambergMario Zarra Geffert, PA-C [facility] [phone] 01/20/2016 3:36 PM

## 2016-11-22 DIAGNOSIS — R5383 Other fatigue: Secondary | ICD-10-CM | POA: Diagnosis not present

## 2016-11-22 DIAGNOSIS — E559 Vitamin D deficiency, unspecified: Secondary | ICD-10-CM | POA: Diagnosis not present

## 2016-11-22 DIAGNOSIS — E46 Unspecified protein-calorie malnutrition: Secondary | ICD-10-CM | POA: Diagnosis not present

## 2016-11-22 DIAGNOSIS — R350 Frequency of micturition: Secondary | ICD-10-CM | POA: Diagnosis not present

## 2016-11-22 DIAGNOSIS — E782 Mixed hyperlipidemia: Secondary | ICD-10-CM | POA: Diagnosis not present

## 2016-11-22 DIAGNOSIS — I10 Essential (primary) hypertension: Secondary | ICD-10-CM | POA: Diagnosis not present

## 2016-11-23 ENCOUNTER — Encounter (HOSPITAL_COMMUNITY): Payer: Self-pay | Admitting: *Deleted

## 2016-11-23 ENCOUNTER — Ambulatory Visit (HOSPITAL_COMMUNITY)
Admission: EM | Admit: 2016-11-23 | Discharge: 2016-11-23 | Disposition: A | Discharge status: Home / Self Care | Payer: Medicare Other | Attending: Family Medicine | Admitting: Family Medicine

## 2016-11-23 DIAGNOSIS — W57XXXA Bitten or stung by nonvenomous insect and other nonvenomous arthropods, initial encounter: Secondary | ICD-10-CM

## 2016-11-23 DIAGNOSIS — R21 Rash and other nonspecific skin eruption: Secondary | ICD-10-CM | POA: Diagnosis not present

## 2016-11-23 NOTE — ED Triage Notes (Signed)
Pt  Reports  He lives  Alone  He reports     Symptoms  Of      Rash  Possible  Bug bites     He  Was  Seen   2  Days  Ago  For  A  Rash and  Was  Placed  On  Cream   He  Brought  With  Him       2   Insects  In a  Bag         That  He  Has  Found at his  House        He  Displays  No  Angioedema   And  Is  Sitting  Upright  On  The  Exam table     In no  Acute  Distress  His  Sister  Is  With  Him

## 2016-11-23 NOTE — Discharge Instructions (Signed)
So sorry I could not given you medication to stop the bugs from biting. Please try to wash your cot and blankets to help with bugs. Keep your f/u with the primary care for further recommendations.

## 2016-11-23 NOTE — ED Provider Notes (Signed)
CSN: [id number]     Arrival date & time 11/23/16  1242 History   First MD Initiated Contact with Patient 11/23/16 1423     Chief Complaint  Patient presents with  . Rash   (Consider location/radiation/quality/duration/timing/severity/associated sxs/prior Treatment) Patient presents today for bug bites. His friend also present tells me that he was seen by his PCP 2 days ago for this. He was given Kenalog cream but refuses to use it. He brings me a small insect in a ziploc and would like "something to stop the bugs from biting". He had his home fumigated but has not washed his cot or clothes. He cannot sleep. He denies a rash of any kind. No additional symptoms.       Past Medical History:  Diagnosis Date  . Shortness of breath dyspnea    with humidity    Past Surgical History:  Procedure Laterality Date  . APPENDECTOMY    . INGUINAL HERNIA REPAIR Bilateral 05/05/2015   Procedure: LAPAROSCOPIC BILATERAL INGUINAL HERNIA REPAIRS WITH MESH;  Surgeon: Axel FillerArmando Ramirez, MD;  Location: [facility];  Service: General;  Laterality: Bilateral;  . INSERTION OF MESH Bilateral 05/05/2015   Procedure: INSERTION OF MESH;  Surgeon: Axel FillerArmando Ramirez, MD;  Location: [facility];  Service: General;  Laterality: Bilateral;   History reviewed. No pertinent family history. Social History  Substance Use Topics  . Smoking status: Never Smoker  . Smokeless tobacco: Never Used  . Alcohol use No    Review of Systems  Allergies  Patient has no known allergies.  Home Medications   Prior to Admission medications   Not on File   Meds Ordered and Administered this Visit  Medications - No data to display  BP 130/70 (BP Location: Right Arm)   Pulse 78   Temp 98.6 F (37 C) (Oral)   Resp 18   SpO2 100%  No data found.   Physical Exam  Constitutional: He appears well-developed and well-nourished. No distress.  HENT:  Head: Normocephalic and atraumatic.  Neurological: He is alert.  Skin: Skin is warm and  dry. He is not diaphoretic.  Skin was dry throughout without any evidence of rash or bites  Psychiatric:  His behavior was eccentric and he appeared distracted  Nursing note and vitals reviewed.   Urgent Care Course   Clinical Course     Procedures (including critical care time)  Labs Review Labs Reviewed - No data to display  Imaging Review No results found.   Visual Acuity Review  Right Eye Distance:   Left Eye Distance:   Bilateral Distance:    Right Eye Near:   Left Eye Near:    Bilateral Near:         MDM   1. Bug bite, initial encounter    Unfortunately there are not medications to prevent insect bites. I offered a low dose hydroxyzine but he refused. He refused to try OTC benadryl cream also. He has close f/u with PCP per patient's friend and will f/u there. We discussed clean linens and clothes preventively.     Riki SheerMichelle G Wylie Coon, PA-C 11/23/16 1514

## 2016-12-16 DIAGNOSIS — Z0001 Encounter for general adult medical examination with abnormal findings: Secondary | ICD-10-CM | POA: Diagnosis not present

## 2016-12-18 DIAGNOSIS — Z0000 Encounter for general adult medical examination without abnormal findings: Secondary | ICD-10-CM | POA: Diagnosis not present

## 2017-03-12 ENCOUNTER — Encounter: Payer: Self-pay | Admitting: Internal Medicine

## 2017-06-13 ENCOUNTER — Ambulatory Visit (HOSPITAL_COMMUNITY)
Admission: EM | Admit: 2017-06-13 | Discharge: 2017-06-13 | Disposition: A | Discharge status: Home / Self Care | Payer: Medicare Other | Attending: Physician Assistant | Admitting: Physician Assistant

## 2017-06-13 ENCOUNTER — Encounter (HOSPITAL_COMMUNITY): Payer: Self-pay | Admitting: Family Medicine

## 2017-06-13 DIAGNOSIS — L02212 Cutaneous abscess of back [any part, except buttock]: Secondary | ICD-10-CM | POA: Insufficient documentation

## 2017-06-13 DIAGNOSIS — L0291 Cutaneous abscess, unspecified: Secondary | ICD-10-CM | POA: Diagnosis not present

## 2017-06-13 NOTE — Discharge Instructions (Signed)
Keep wound clean and dry. You can take Tylenol/motrin for pain. Monitor your wound. You have chosen not to take antibiotics today, please monitor for any worsening of symptoms, fever, surrounding redness, warmth, to follow up for reevaluation.

## 2017-06-13 NOTE — ED Triage Notes (Signed)
Pt here for abscess to right shoulder and upper back.

## 2017-06-13 NOTE — ED Provider Notes (Signed)
CSN: [id number]     Arrival date & time 06/13/17  1246 History   None    Chief Complaint  Patient presents with  . Abscess   (Consider location/radiation/quality/duration/timing/severity/associated sxs/prior Treatment) 71 year old male comes in for a few months history of abscess on his right upper back/shoulder. He states he's coming in today because it is now hurting him. Denies fever, chills, night sweats. Denies surrounding erythema, increased warmth. Is not taken anything for it. Denies history of diabetes.      Past Medical History:  Diagnosis Date  . Shortness of breath dyspnea    with humidity    Past Surgical History:  Procedure Laterality Date  . APPENDECTOMY    . INGUINAL HERNIA REPAIR Bilateral 05/05/2015   Procedure: LAPAROSCOPIC BILATERAL INGUINAL HERNIA REPAIRS WITH MESH;  Surgeon: Axel FillerArmando Ramirez, MD;  Location: [facility];  Service: General;  Laterality: Bilateral;  . INSERTION OF MESH Bilateral 05/05/2015   Procedure: INSERTION OF MESH;  Surgeon: Axel FillerArmando Ramirez, MD;  Location: [facility];  Service: General;  Laterality: Bilateral;   History reviewed. No pertinent family history. Social History  Substance Use Topics  . Smoking status: Never Smoker  . Smokeless tobacco: Never Used  . Alcohol use No    Review of Systems  Reason unable to perform ROS: See HPI as above.    Allergies  Patient has no known allergies.  Home Medications   Prior to Admission medications   Not on File   Meds Ordered and Administered this Visit  Medications - No data to display  BP 126/83   Pulse 77   Temp 98.6 F (37 C)   Resp 18   SpO2 97%  No data found.   Physical Exam  Constitutional: He is oriented to person, place, and time. He appears well-developed and well-nourished. No distress.  HENT:  Head: Normocephalic and atraumatic.  Neurological: He is alert and oriented to person, place, and time.  Skin: Skin is warm and dry.  2 cm abscess on right upper back/shoulder.  No surrounding erythema, increased warmth. No tenderness on palpation. Not affecting range of motion of shoulder or back.  Psychiatric: He has a normal mood and affect. His behavior is normal. Judgment normal.    Urgent Care Course     .Marland Kitchen.Incision and Drainage Date/Time: 06/13/2017 2:17 PM Performed by: Cathie HoopsYU, AMY V Authorized by: Linward HeadlandYU, AMY V   Consent:    Consent obtained:  Verbal   Consent given by:  Patient   Risks discussed:  Bleeding, incomplete drainage, pain, infection and damage to other organs   Alternatives discussed:  Alternative treatment Location:    Type:  Abscess   Size:  2cm   Location:  Trunk   Trunk location:  Back Pre-procedure details:    Skin preparation:  Betadine Anesthesia (see MAR for exact dosages):    Anesthesia method:  Local infiltration   Local anesthetic:  Lidocaine 2% w/o epi Procedure type:    Complexity:  Simple Procedure details:    Needle aspiration: no     Incision types:  Single straight   Incision depth:  Submucosal   Scalpel blade:  11   Wound management:  Probed and deloculated   Drainage:  Bloody and purulent   Drainage amount:  Copious   Wound treatment:  Wound left open   Packing materials:  None Post-procedure details:    Patient tolerance of procedure:  Tolerated well, no immediate complications   (including critical care time)  Labs Review Labs Reviewed -  No data to display  Imaging Review No results found.         MDM   1. Abscess    Patient tolerated procedure well. Cultures sent. Patient would like to defer antibiotic use. Given without surrounding erythema, will defer antibiotic as of right now. Discussed may need antibiotic in the future despite I&D today, patient express understanding. Keep wound clean and dry, monitor for worsening of symptoms such as fever, surrounding erythema, increased warmth, to follow-up for further evaluation.   Belinda FisherYu, Amy V, PA-C 06/13/17 1420

## 2017-06-19 LAB — AEROBIC/ANAEROBIC CULTURE (SURGICAL/DEEP WOUND): SPECIAL REQUESTS: NORMAL

## 2017-06-19 LAB — AEROBIC/ANAEROBIC CULTURE W GRAM STAIN (SURGICAL/DEEP WOUND)

## 2017-07-24 DIAGNOSIS — Z23 Encounter for immunization: Secondary | ICD-10-CM | POA: Diagnosis not present

## 2017-11-02 ENCOUNTER — Encounter (HOSPITAL_COMMUNITY): Payer: Self-pay | Admitting: *Deleted

## 2017-11-02 ENCOUNTER — Ambulatory Visit (HOSPITAL_COMMUNITY)
Admission: EM | Admit: 2017-11-02 | Discharge: 2017-11-02 | Disposition: A | Discharge status: Home / Self Care | Payer: Medicare Other | Attending: Internal Medicine | Admitting: Internal Medicine

## 2017-11-02 ENCOUNTER — Other Ambulatory Visit: Payer: Self-pay

## 2017-11-02 DIAGNOSIS — N481 Balanitis: Secondary | ICD-10-CM | POA: Diagnosis not present

## 2017-11-02 DIAGNOSIS — N4889 Other specified disorders of penis: Secondary | ICD-10-CM | POA: Diagnosis not present

## 2017-11-02 DIAGNOSIS — R369 Urethral discharge, unspecified: Secondary | ICD-10-CM | POA: Diagnosis present

## 2017-11-02 MED ORDER — FLUCONAZOLE 150 MG PO TABS: 150.0000 mg | ORAL_TABLET | Freq: Once | ORAL | 0 refills | Status: AC

## 2017-11-02 MED ORDER — CLOTRIMAZOLE 1 % EX CREA: TOPICAL_CREAM | CUTANEOUS | 0 refills | Status: DC

## 2017-11-02 NOTE — ED Provider Notes (Signed)
[facility]    CSN: [id number] Arrival date & time: 11/02/17  1243     History   Chief Complaint Chief Complaint  Patient presents with  . Pelvic Pain    HPI Floria RavelingBennie Moehring is a 71 y.o. male.   Javien presents with complaints of penile swelling and white discharge which has been present for approximately 1 month. He states he has a hard time retracting his foreskin due to the swelling. States he did have redness and swelling to tip of penis which has improved. Without fevers, abdominal pain or urinary symptoms. States his stream has been affected due to swelling. History of inguinal hernia repair. Does not take any medications regularly. dneies known std risk, has not been sexually active in months.     ROS per HPI.       Past Medical History:  Diagnosis Date  . Shortness of breath dyspnea    with humidity     There are no active problems to display for this patient.   Past Surgical History:  Procedure Laterality Date  . APPENDECTOMY    . INGUINAL HERNIA REPAIR Bilateral 05/05/2015   Procedure: LAPAROSCOPIC BILATERAL INGUINAL HERNIA REPAIRS WITH MESH;  Surgeon: Axel FillerArmando Ramirez, MD;  Location: [facility];  Service: General;  Laterality: Bilateral;  . INSERTION OF MESH Bilateral 05/05/2015   Procedure: INSERTION OF MESH;  Surgeon: Axel FillerArmando Ramirez, MD;  Location: [facility];  Service: General;  Laterality: Bilateral;       Home Medications    Prior to Admission medications   Medication Sig Start Date End Date Taking? Authorizing Provider  clotrimazole (LOTRIMIN) 1 % cream Apply to affected area 2 times daily 11/02/17   Linus MakoBurky, Mirela Parsley B, NP  fluconazole (DIFLUCAN) 150 MG tablet Take 1 tablet (150 mg total) by mouth once for 1 dose. 11/02/17 11/02/17  Georgetta HaberBurky, Floraine Buechler B, NP    Family History History reviewed. No pertinent family history.  Social History Social History   Tobacco Use  . Smoking status: Never Smoker  . Smokeless tobacco: Never Used  Substance  Use Topics  . Alcohol use: No    Alcohol/week: 0.0 oz  . Drug use: No     Allergies   Patient has no known allergies.   Review of Systems Review of Systems   Physical Exam Triage Vital Signs ED Triage Vitals [11/02/17 1307]  Enc Vitals Group     BP (!) 193/106     Pulse Rate 63     Resp      Temp 98.4 F (36.9 C)     Temp Source Oral     SpO2 99 %     Weight      Height      Head Circumference      Peak Flow      Pain Score      Pain Loc      Pain Edu?      Excl. in GC?    No data found.  Updated Vital Signs BP (!) 193/106 (BP Location: Left Arm)   Pulse 63   Temp 98.4 F (36.9 C) (Oral)   SpO2 99%   Visual Acuity Right Eye Distance:   Left Eye Distance:   Bilateral Distance:    Right Eye Near:   Left Eye Near:    Bilateral Near:     Physical Exam  Constitutional: He is oriented to person, place, and time. He appears well-developed and well-nourished.  Cardiovascular: Normal rate and regular rhythm.  Pulmonary/Chest: Effort normal and breath sounds normal.  Abdominal: Soft. He exhibits no mass. There is no tenderness. No hernia.  Genitourinary: Uncircumcised. Discharge found.  Genitourinary Comments: Foreskin does not retract over penile tip completely due to swelling to tissue; without redness; without sores present; thick white discharge noted  Neurological: He is alert and oriented to person, place, and time.  Skin: Skin is warm and dry.     UC Treatments / Results  Labs (all labs ordered are listed, but only abnormal results are displayed) Labs Reviewed  URINE CYTOLOGY ANCILLARY ONLY    EKG  EKG Interpretation None       Radiology No results found.  Procedures Procedures (including critical care time)  Medications Ordered in UC Medications - No data to display   Initial Impression / Assessment and Plan / UC Course  I have reviewed the triage vital signs and the nursing notes.  Pertinent labs & imaging results that were  available during my care of the patient were reviewed by me and considered in my medical decision making (see chart for details).     Treating for yeast based on physical exam, urine cytology pending. Return precautions provided. Patient verbalized understanding and agreeable to plan.    Final Clinical Impressions(s) / UC Diagnoses   Final diagnoses:  Balanitis    ED Discharge Orders        Ordered    clotrimazole (LOTRIMIN) 1 % cream     11/02/17 1347    fluconazole (DIFLUCAN) 150 MG tablet   Once     11/02/17 1347       Controlled Substance Prescriptions [place] Controlled Substance Registry consulted? Not Applicable   Georgetta HaberBurky, Crystalyn Delia B, NP 11/02/17 1352

## 2017-11-02 NOTE — Discharge Instructions (Signed)
Please take the 1 tablet prescribed today. May apply the cream twice a day to further treat infection.  If symptoms worsen or do not improve in the next week to return to be seen or to follow up with PCP.

## 2017-11-02 NOTE — ED Notes (Signed)
Informed Amy of pt BP

## 2017-11-02 NOTE — ED Triage Notes (Signed)
Per pt his groin feels like something is there, per pt it does not hurt when he urinate or burn, per pt his "manhood" under the skin has tingling sensition and has white spots on it.

## 2017-11-03 LAB — URINE CYTOLOGY ANCILLARY ONLY
Chlamydia: NEGATIVE
NEISSERIA GONORRHEA: NEGATIVE
TRICH (WINDOWPATH): NEGATIVE

## 2017-11-04 LAB — URINE CYTOLOGY ANCILLARY ONLY
Bacterial vaginitis: NEGATIVE
Candida vaginitis: NEGATIVE

## 2017-11-16 ENCOUNTER — Encounter (HOSPITAL_COMMUNITY): Payer: Self-pay | Admitting: Emergency Medicine

## 2017-11-16 ENCOUNTER — Ambulatory Visit (HOSPITAL_COMMUNITY)
Admission: EM | Admit: 2017-11-16 | Discharge: 2017-11-16 | Disposition: A | Discharge status: Home / Self Care | Payer: Medicare Other | Attending: Internal Medicine | Admitting: Internal Medicine

## 2017-11-16 DIAGNOSIS — N476 Balanoposthitis: Secondary | ICD-10-CM | POA: Insufficient documentation

## 2017-11-16 DIAGNOSIS — N481 Balanitis: Secondary | ICD-10-CM | POA: Diagnosis present

## 2017-11-16 MED ORDER — AMOXICILLIN-POT CLAVULANATE 875-125 MG PO TABS: 1.0000 | ORAL_TABLET | Freq: Two times a day (BID) | ORAL | 0 refills | Status: DC

## 2017-11-16 MED ORDER — FLUCONAZOLE 150 MG PO TABS: ORAL_TABLET | ORAL | 0 refills | Status: DC

## 2017-11-16 NOTE — Discharge Instructions (Signed)
Go to the drug store and pick up the medicine prescribed and take as directed. May also apply a small amount of hydrocortisone 1% cream. You are being referred to a urologist.

## 2017-11-16 NOTE — ED Provider Notes (Signed)
[facility]    CSN: [id number] Arrival date & time: 11/16/17  1203     History   Chief Complaint No chief complaint on file.   HPI Jerry [name] is a 71 y.o. male.   71 year old male presents approximately 2 weeks after he was here for evaluation of a balanitis. At that time he had a grade purulent exudate, phimosis and swelling of the poor skin and some redness of the glans. He was treated with miconazole and Diflucan tablet. He states he has been compliant with his medications but he is not getting any better.      Past Medical History:  Diagnosis Date  . Shortness of breath dyspnea    with humidity     There are no active problems to display for this patient.   Past Surgical History:  Procedure Laterality Date  . APPENDECTOMY    . INGUINAL HERNIA REPAIR Bilateral 05/05/2015   Procedure: LAPAROSCOPIC BILATERAL INGUINAL HERNIA REPAIRS WITH MESH;  Surgeon: Axel FillerArmando Ramirez, MD;  Location: [facility];  Service: General;  Laterality: Bilateral;  . INSERTION OF MESH Bilateral 05/05/2015   Procedure: INSERTION OF MESH;  Surgeon: Axel FillerArmando Ramirez, MD;  Location: [facility];  Service: General;  Laterality: Bilateral;       Home Medications    Prior to Admission medications   Medication Sig Start Date End Date Taking? Authorizing Provider  amoxicillin-clavulanate (AUGMENTIN) 875-125 MG tablet Take 1 tablet by mouth every 12 (twelve) hours. 11/16/17   Hayden RasmussenMabe, Dawn Convery, NP  clotrimazole (LOTRIMIN) 1 % cream Apply to affected area 2 times daily 11/02/17   Linus MakoBurky, Natalie B, NP  fluconazole (DIFLUCAN) 150 MG tablet 1 tab po every other day. 11/16/17   Hayden RasmussenMabe, Warnie Belair, NP    Family History History reviewed. No pertinent family history.  Social History Social History   Tobacco Use  . Smoking status: Never Smoker  . Smokeless tobacco: Never Used  Substance Use Topics  . Alcohol use: No    Alcohol/week: 0.0 oz  . Drug use: No     Allergies   Patient has no known  allergies.   Review of Systems Review of Systems  Constitutional: Negative.   Genitourinary: Positive for penile pain. Negative for dysuria, frequency and testicular pain.       Urine stream tends to scatter rather dentistry straightly. But no dysuria  All other systems reviewed and are negative.    Physical Exam Triage Vital Signs ED Triage Vitals  Enc Vitals Group     BP      Pulse      Resp      Temp      Temp src      SpO2      Weight      Height      Head Circumference      Peak Flow      Pain Score      Pain Loc      Pain Edu?      Excl. in GC?    No data found.  Updated Vital Signs BP (!) 161/90 (BP Location: Left Arm) Comment: notified Alinda Moneyony  Pulse 68   Temp 98.3 F (36.8 C) (Oral)   Resp 18   SpO2 99%   Visual Acuity Right Eye Distance:   Left Eye Distance:   Bilateral Distance:    Right Eye Near:   Left Eye Near:    Bilateral Near:     Physical Exam  Constitutional: He appears well-developed  and well-nourished. No distress.  Neck: Neck supple.  Pulmonary/Chest: Effort normal.  Genitourinary:  Genitourinary Comments: There is phimosis. Able to partially retract.Swelling of the distal foreskin. Unable to retract well over well from the meatus. There is erythema to the meatus and lesser erythema to the glans. Light erythema and pallor to the foreskin. Moderate amount of gray to white thin exudate. In collecting the swabs they are introduced easily within the space between penis and fore skin.  Neurological: He is alert.  Skin: Skin is warm.  Nursing note and vitals reviewed.    UC Treatments / Results  Labs (all labs ordered are listed, but only abnormal results are displayed) Labs Reviewed  AEROBIC CULTURE (SUPERFICIAL SPECIMEN)  CYTOLOGY, (ORAL, ANAL, URETHRAL) ANCILLARY ONLY    EKG  EKG Interpretation None       Radiology No results found.  Procedures Procedures (including critical care time)  Medications Ordered in  UC Medications - No data to display   Initial Impression / Assessment and Plan / UC Course  I have reviewed the triage vital signs and the nursing notes.  Pertinent labs & imaging results that were available during my care of the patient were reviewed by me and considered in my medical decision making (see chart for details).    Go to the drug store and pick up the medicine prescribed and take as directed. May also apply a small amount of hydrocortisone 1% cream. You are being referred to a urologist.  Cytology swabs and culture swabs for potential strep or other bacterial organisms obtained and sent.  Final Clinical Impressions(s) / UC Diagnoses   Final diagnoses:  Balanoposthitis    ED Discharge Orders        Ordered    amoxicillin-clavulanate (AUGMENTIN) 875-125 MG tablet  Every 12 hours     11/16/17 1304    fluconazole (DIFLUCAN) 150 MG tablet     11/16/17 1304       Controlled Substance Prescriptions [place] Controlled Substance Registry consulted? Not Applicable   Hayden RasmussenMabe, Jaloni Sorber, NP 11/16/17 [phone]

## 2017-11-17 LAB — CYTOLOGY, (ORAL, ANAL, URETHRAL) ANCILLARY ONLY
CANDIDA VAGINITIS: NEGATIVE
CHLAMYDIA, DNA PROBE: NEGATIVE
NEISSERIA GONORRHEA: NEGATIVE
Trichomonas: NEGATIVE

## 2017-11-19 DIAGNOSIS — N471 Phimosis: Secondary | ICD-10-CM | POA: Diagnosis not present

## 2017-11-19 LAB — AEROBIC CULTURE  (SUPERFICIAL SPECIMEN)

## 2017-11-19 LAB — AEROBIC CULTURE W GRAM STAIN (SUPERFICIAL SPECIMEN)

## 2017-11-20 ENCOUNTER — Telehealth (HOSPITAL_COMMUNITY): Payer: Self-pay | Admitting: *Deleted

## 2017-11-24 ENCOUNTER — Other Ambulatory Visit: Payer: Self-pay | Admitting: Urology

## 2017-11-24 ENCOUNTER — Other Ambulatory Visit: Payer: Self-pay

## 2017-11-24 ENCOUNTER — Encounter (HOSPITAL_BASED_OUTPATIENT_CLINIC_OR_DEPARTMENT_OTHER): Payer: Self-pay | Admitting: *Deleted

## 2017-11-24 NOTE — Progress Notes (Signed)
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 Needs hemaglobin

## 2017-12-08 ENCOUNTER — Encounter (HOSPITAL_BASED_OUTPATIENT_CLINIC_OR_DEPARTMENT_OTHER): Payer: Self-pay

## 2017-12-08 ENCOUNTER — Encounter (HOSPITAL_BASED_OUTPATIENT_CLINIC_OR_DEPARTMENT_OTHER)
Admission: RE | Disposition: A | Discharge status: Home / Self Care | Payer: Self-pay | Source: Ambulatory Visit | Attending: Urology

## 2017-12-08 ENCOUNTER — Ambulatory Visit (HOSPITAL_BASED_OUTPATIENT_CLINIC_OR_DEPARTMENT_OTHER): Payer: Medicare Other | Admitting: Anesthesiology

## 2017-12-08 ENCOUNTER — Ambulatory Visit (HOSPITAL_BASED_OUTPATIENT_CLINIC_OR_DEPARTMENT_OTHER)
Admission: RE | Admit: 2017-12-08 | Discharge: 2017-12-08 | Disposition: A | Discharge status: Home / Self Care | Payer: Medicare Other | Source: Ambulatory Visit | Attending: Urology | Admitting: Urology

## 2017-12-08 DIAGNOSIS — N471 Phimosis: Secondary | ICD-10-CM | POA: Insufficient documentation

## 2017-12-08 DIAGNOSIS — R300 Dysuria: Secondary | ICD-10-CM | POA: Diagnosis not present

## 2017-12-08 HISTORY — PX: CIRCUMCISION: SHX1350

## 2017-12-08 HISTORY — DX: Phimosis: N47.1

## 2017-12-08 HISTORY — DX: Unspecified osteoarthritis, unspecified site: M19.90

## 2017-12-08 LAB — POCT HEMOGLOBIN-HEMACUE: HEMOGLOBIN: 17.4 g/dL — AB (ref 13.0–17.0)

## 2017-12-08 SURGERY — CIRCUMCISION, ADULT
Anesthesia: General

## 2017-12-08 MED ORDER — OXYCODONE HCL 5 MG/5ML PO SOLN
5.0000 mg | Freq: Once | ORAL | Status: DC | PRN
  Filled 2017-12-08: qty 5

## 2017-12-08 MED ORDER — CEPHALEXIN 500 MG PO CAPS: 500.0000 mg | ORAL_CAPSULE | Freq: Three times a day (TID) | ORAL | 0 refills | Status: AC

## 2017-12-08 MED ORDER — PROPOFOL 10 MG/ML IV BOLUS
INTRAVENOUS | Status: DC | PRN
  Administered 2017-12-08: 200 mg via INTRAVENOUS

## 2017-12-08 MED ORDER — CEFAZOLIN SODIUM-DEXTROSE 2-4 GM/100ML-% IV SOLN
2.0000 g | INTRAVENOUS | Status: AC
  Administered 2017-12-08: 2 g via INTRAVENOUS
  Filled 2017-12-08: qty 100

## 2017-12-08 MED ORDER — DEXAMETHASONE SODIUM PHOSPHATE 10 MG/ML IJ SOLN
INTRAMUSCULAR | Status: DC | PRN
  Administered 2017-12-08: 10 mg via INTRAVENOUS

## 2017-12-08 MED ORDER — OXYCODONE-ACETAMINOPHEN 5-325 MG PO TABS: 1.0000 | ORAL_TABLET | ORAL | 0 refills | Status: DC | PRN

## 2017-12-08 MED ORDER — PROMETHAZINE HCL 25 MG/ML IJ SOLN
6.2500 mg | INTRAMUSCULAR | Status: DC | PRN
  Filled 2017-12-08: qty 1

## 2017-12-08 MED ORDER — LACTATED RINGERS IV SOLN
INTRAVENOUS | Status: DC
  Administered 2017-12-08 (×2): via INTRAVENOUS
  Filled 2017-12-08: qty 1000

## 2017-12-08 MED ORDER — BACITRACIN-NEOMYCIN-POLYMYXIN 400-5-5000 EX OINT
TOPICAL_OINTMENT | CUTANEOUS | Status: DC | PRN
  Administered 2017-12-08: 1 via TOPICAL

## 2017-12-08 MED ORDER — BUPIVACAINE HCL 0.5 % IJ SOLN
INTRAMUSCULAR | Status: DC | PRN
  Administered 2017-12-08: 10 mL

## 2017-12-08 MED ORDER — PHENYLEPHRINE 40 MCG/ML (10ML) SYRINGE FOR IV PUSH (FOR BLOOD PRESSURE SUPPORT)
PREFILLED_SYRINGE | INTRAVENOUS | Status: DC | PRN
Start: 2017-12-08 — End: 2017-12-08
  Administered 2017-12-08: 120 ug via INTRAVENOUS
  Administered 2017-12-08: 80 ug via INTRAVENOUS
  Administered 2017-12-08: 120 ug via INTRAVENOUS

## 2017-12-08 MED ORDER — OXYCODONE HCL 5 MG PO TABS
5.0000 mg | ORAL_TABLET | Freq: Once | ORAL | Status: DC | PRN
  Filled 2017-12-08: qty 1

## 2017-12-08 MED ORDER — PROPOFOL 10 MG/ML IV BOLUS
INTRAVENOUS | Status: AC
  Filled 2017-12-08: qty 20

## 2017-12-08 MED ORDER — ONDANSETRON HCL 4 MG/2ML IJ SOLN
INTRAMUSCULAR | Status: DC | PRN
  Administered 2017-12-08: 4 mg via INTRAVENOUS

## 2017-12-08 MED ORDER — CEFAZOLIN SODIUM-DEXTROSE 2-4 GM/100ML-% IV SOLN
INTRAVENOUS | Status: AC
  Filled 2017-12-08: qty 100

## 2017-12-08 MED ORDER — FENTANYL CITRATE (PF) 100 MCG/2ML IJ SOLN
INTRAMUSCULAR | Status: DC | PRN
  Administered 2017-12-08: 50 ug via INTRAVENOUS
  Administered 2017-12-08: 25 ug via INTRAVENOUS

## 2017-12-08 MED ORDER — FENTANYL CITRATE (PF) 100 MCG/2ML IJ SOLN
INTRAMUSCULAR | Status: AC
  Filled 2017-12-08: qty 2

## 2017-12-08 MED ORDER — KETOROLAC TROMETHAMINE 30 MG/ML IJ SOLN
15.0000 mg | Freq: Once | INTRAMUSCULAR | Status: DC | PRN
  Filled 2017-12-08: qty 1

## 2017-12-08 MED ORDER — LIDOCAINE 2% (20 MG/ML) 5 ML SYRINGE
INTRAMUSCULAR | Status: DC | PRN
  Administered 2017-12-08: 60 mg via INTRAVENOUS

## 2017-12-08 MED ORDER — FENTANYL CITRATE (PF) 100 MCG/2ML IJ SOLN
25.0000 ug | INTRAMUSCULAR | Status: DC | PRN
  Filled 2017-12-08: qty 1

## 2017-12-08 SURGICAL SUPPLY — 26 items
BLADE CLIPPER SURG (BLADE) ×3 IMPLANT
BLADE HEX COATED 2.75 (ELECTRODE) ×3 IMPLANT
BLADE SURG 15 STRL LF DISP TIS (BLADE) ×1 IMPLANT
BLADE SURG 15 STRL SS (BLADE) ×2
BNDG CONFORM 2 STRL LF (GAUZE/BANDAGES/DRESSINGS) ×3 IMPLANT
COVER BACK TABLE 60X90IN (DRAPES) ×3 IMPLANT
COVER MAYO STAND STRL (DRAPES) ×3 IMPLANT
DECANTER SPIKE VIAL GLASS SM (MISCELLANEOUS) IMPLANT
DRAPE LAPAROTOMY 100X72 PEDS (DRAPES) ×3 IMPLANT
ELECT REM PT RETURN 9FT ADLT (ELECTROSURGICAL) ×3
ELECTRODE REM PT RTRN 9FT ADLT (ELECTROSURGICAL) ×1 IMPLANT
GAUZE VASELINE 1X8 (GAUZE/BANDAGES/DRESSINGS) ×3 IMPLANT
GLOVE BIOGEL PI IND STRL 7.5 (GLOVE) ×1 IMPLANT
GLOVE BIOGEL PI INDICATOR 7.5 (GLOVE) ×2
GLOVE SURG SS PI 7.0 STRL IVOR (GLOVE) ×3 IMPLANT
GOWN STRL REUS W/TWL XL LVL3 (GOWN DISPOSABLE) ×3 IMPLANT
KIT RM TURNOVER CYSTO AR (KITS) ×3 IMPLANT
NEEDLE HYPO 25X1 1.5 SAFETY (NEEDLE) ×3 IMPLANT
NS IRRIG 500ML POUR BTL (IV SOLUTION) ×3 IMPLANT
PACK BASIN DAY SURGERY FS (CUSTOM PROCEDURE TRAY) ×3 IMPLANT
PENCIL BUTTON HOLSTER BLD 10FT (ELECTRODE) ×3 IMPLANT
SUT CHROMIC 3 0 SH 27 (SUTURE) ×6 IMPLANT
SYR CONTROL 10ML LL (SYRINGE) IMPLANT
TOWEL OR 17X24 6PK STRL BLUE (TOWEL DISPOSABLE) ×6 IMPLANT
TRAY DSU PREP LF (CUSTOM PROCEDURE TRAY) ×3 IMPLANT
WATER STERILE IRR 500ML POUR (IV SOLUTION) IMPLANT

## 2017-12-08 NOTE — Anesthesia Postprocedure Evaluation (Signed)
Anesthesia Post Note  Patient: Jerry [name]  Procedure(s) Performed: CIRCUMCISION ADULT (N/A )     Patient location during evaluation: PACU Anesthesia Type: General Level of consciousness: awake and alert Pain management: pain level controlled Vital Signs Assessment: post-procedure vital signs reviewed and stable Respiratory status: spontaneous breathing, nonlabored ventilation, respiratory function stable and patient connected to nasal cannula oxygen Cardiovascular status: blood pressure returned to baseline and stable Postop Assessment: no apparent nausea or vomiting Anesthetic complications: no    Last Vitals:  Vitals:   12/08/17 1418 12/08/17 1424  BP: (!) 150/81   Pulse: 65 71  Resp: 11 11  Temp: (!) 36.3 C   SpO2: 100% 100%    Last Pain:  Vitals:   12/08/17 1044  TempSrc:   PainSc: 5                  Chelesa Weingartner S

## 2017-12-08 NOTE — H&P (Signed)
CC: I have trouble rolling my foreskin back.  HPI: Jerry [name] is a 72 year-old male patient who is here for trouble pulling back my foreskin.  His symptoms have gotten worse over the last year.   He does have dysuria. He has had inflammation of the head of his penis. He has had to use creams on the head of his penis.   He would not like to be circumcised.   Patient presents today to discuss his phimosis. This has been going on for a few months now. He is unable to retract his foreskin. He finds this increasingly bothersome. He has been to the urgent care twice for this. They have start him on creams and oral antibiotics and antifungal. These have not helped. He is becoming increasingly bothered by this. He is interested in circumcision.     ALLERGIES: None   MEDICATIONS: Amoxicillin  Fluticasone Propionate     GU PSH: None   NON-GU PSH: Appendectomy - 1966 Hernia Repair - 2017    GU PMH: None   NON-GU PMH: Arthritis    FAMILY HISTORY: Cancer - Sister, Mother Death - Mother, Father Heart Disease - Father   SOCIAL HISTORY: Marital Status: Unknown Preferred Language: English; Ethnicity: Not Hispanic Or Latino; Race: White Current Smoking Status: Patient has never smoked.   Tobacco Use Assessment Completed: Used Tobacco in last 30 days? Has never drank.  Drinks 2 caffeinated drinks per day.    REVIEW OF SYSTEMS:    GU Review Male:   Patient denies get up at night to urinate, frequent urination, have to strain to urinate , stream starts and stops, leakage of urine, hard to postpone urination, erection problems, trouble starting your stream, penile pain, and burning/ pain with urination.  Gastrointestinal (Upper):   Patient denies nausea, vomiting, and indigestion/ heartburn.  Gastrointestinal (Lower):   Patient denies diarrhea and constipation.  Constitutional:   Patient denies fever, night sweats, weight loss, and fatigue.  Skin:   Patient denies skin rash/ lesion and  itching.  Eyes:   Patient denies blurred vision and double vision.  Ears/ Nose/ Throat:   Patient denies sore throat and sinus problems.  Hematologic/Lymphatic:   Patient denies swollen glands and easy bruising.  Cardiovascular:   Patient denies leg swelling and chest pains.  Respiratory:   Patient denies cough and shortness of breath.  Endocrine:   Patient denies excessive thirst.  Musculoskeletal:   Patient denies back pain and joint pain.  Neurological:   Patient denies headaches and dizziness.  Psychologic:   Patient denies depression and anxiety.   VITAL SIGNS:      11/19/2017 02:29 PM  Weight 157 lb / 71.21 kg  Height 68 in / 172.72 cm  BP 160/94 mmHg  Pulse 67 /min  Temperature 97.3 F / 36.2 C  BMI 23.9 kg/m   GU PHYSICAL EXAMINATION:    Scrotum: No lesions. No edema. No cysts. No warts.  Epididymides: Right: no spermatocele, no masses, no cysts, no tenderness, no induration, no enlargement. Left: no spermatocele, no masses, no cysts, no tenderness, no induration, no enlargement.  Testes: No tenderness, no swelling, no enlargement left testes. No tenderness, no swelling, no enlargement right testes. Normal location left testes. Normal location right testes. No mass, no cyst, no varicocele, no hydrocele left testes. No mass, no cyst, no varicocele, no hydrocele right testes.  Urethral Meatus: Normal size. No lesion, no wart, no discharge, no polyp. Normal location.  Penis: Penis uncircumcised, phimosis. No foreskin  warts, no cracks. No dorsal peyronie's plaques, no left corporal peyronie's plaques, no right corporal peyronie's plaques, no scarring, no shaft warts. No balanitis, no meatal stenosis. Phimosis present. Unable to retract foreskin.   MULTI-SYSTEM PHYSICAL EXAMINATION:    Constitutional: Well-nourished. No physical deformities. Normally developed. Good grooming.  Neck: Neck symmetrical, not swollen. Normal tracheal position.  Respiratory: No labored breathing, no use of  accessory muscles.   Cardiovascular: Normal temperature, normal extremity pulses, no swelling, no varicosities.  Lymphatic: No enlargement of neck, axillae, groin.  Skin: No paleness, no jaundice, no cyanosis. No lesion, no ulcer, no rash.  Neurologic / Psychiatric: Oriented to time, oriented to place, oriented to person. No depression, no anxiety, no agitation.  Gastrointestinal: No mass, no tenderness, no rigidity, non obese abdomen.  Eyes: Normal conjunctivae. Normal eyelids.  Ears, Nose, Mouth, and Throat: Left ear no scars, no lesions, no masses. Right ear no scars, no lesions, no masses. Nose no scars, no lesions, no masses. Normal hearing. Normal lips.  Musculoskeletal: Normal gait and station of head and neck.     PAST DATA REVIEWED:  Source Of History:  Patient   PROCEDURES:          Urinalysis w/Scope Dipstick Dipstick Cont'd Micro  Color: Yellow Bilirubin: Neg WBC/hpf: 0 - 5/hpf  Appearance: Clear Ketones: Neg RBC/hpf: NS (Not Seen)  Specific Gravity: 1.025 Blood: 1+ Bacteria: Rare (0-9/hpf)  pH: <=5.0 Protein: Neg Cystals: NS (Not Seen)  Glucose: Neg Urobilinogen: 0.2 Casts: NS (Not Seen)    Nitrites: Neg Trichomonas: Not Present    Leukocyte Esterase: Neg Mucous: Present      Epithelial Cells: 0 - 5/hpf      Yeast: NS (Not Seen)      Sperm: Not Present    ASSESSMENT:      ICD-10 Details  1 GU:   Phimosis - N47.1    PLAN:           Document Letter(s):  Created for Patient: Clinical Summary   Created for MEDICAID [facility]    The patient and I talked at length about etiologies of phimosis. We talked about the relationship between diabetes, the uncircumcised penis, paraphimosis, and phimosis. We talked about the relationship between phimosis and voiding symptoms including urinary retention, pain and irritative voiding symptoms like urgency, frequency, dysuria and others.   We talked about to various topical creams used to treat phimosis.  Circumcision was discussed at length with the patient as a permanent treatment for phimosis. The risks, benefits, and some of the possible complications of the procedure were discussed with the patient including penile glanular hypersensitivity, distal penile shaft scarring, and others. Alternative treatment options were discussed with the patient in detail. All questions were answered.  The general risks of the operative procedure and the perioperative period were discussed with the patient at length and in detail including swelling, pain, nausea, vomiting, fever, chills, infection, wound infection, sepsis, renal failure, internal or external bleeding, intraoperative bowel, organ or vascular injuries, postoperative formation of scar tissue, the need for blood transfusions, deep venous thrombosis or blood clots, pulmonary embolus, pneumonia, respiratory failure, heart attack, stroke, death and others.   The patient gave fully informed consent to proceed with surgical treatment of his phimosis with circumcision.          Notes:   Circumcision today

## 2017-12-08 NOTE — Anesthesia Preprocedure Evaluation (Signed)
Anesthesia Evaluation  Patient identified by MRN, date of birth, ID band Patient awake    Reviewed: Allergy & Precautions, NPO status , Patient's Chart, lab work & pertinent test results  Airway Mallampati: II  TM Distance: >3 FB Neck ROM: Full    Dental no notable dental hx.    Pulmonary neg pulmonary ROS,    Pulmonary exam normal breath sounds clear to auscultation       Cardiovascular negative cardio ROS Normal cardiovascular exam Rhythm:Regular Rate:Normal     Neuro/Psych negative neurological ROS  negative psych ROS   GI/Hepatic negative GI ROS, Neg liver ROS,   Endo/Other  negative endocrine ROS  Renal/GU negative Renal ROS  negative genitourinary   Musculoskeletal negative musculoskeletal ROS (+)   Abdominal   Peds negative pediatric ROS (+)  Hematology negative hematology ROS (+)   Anesthesia Other Findings   Reproductive/Obstetrics negative OB ROS                             Anesthesia Physical Anesthesia Plan  ASA: II  Anesthesia Plan: General   Post-op Pain Management:    Induction: Intravenous  PONV Risk Score and Plan: 2 and Ondansetron, Dexamethasone and Treatment may vary due to age or medical condition  Airway Management Planned: LMA  Additional Equipment:   Intra-op Plan:   Post-operative Plan: Extubation in OR  Informed Consent: I have reviewed the patients History and Physical, chart, labs and discussed the procedure including the risks, benefits and alternatives for the proposed anesthesia with the patient or authorized representative who has indicated his/her understanding and acceptance.     Dental advisory given  Plan Discussed with: CRNA and Surgeon  Anesthesia Plan Comments:         Anesthesia Quick Evaluation  

## 2017-12-08 NOTE — Anesthesia Procedure Notes (Signed)
Procedure Name: LMA Insertion Date/Time: 12/08/2017 1:32 PM Performed by: Minerva EndsMirarchi, Hayle Parisi M, CRNA Pre-anesthesia Checklist: Patient identified, Emergency Drugs available, Suction available and Patient being monitored Patient Re-evaluated:Patient Re-evaluated prior to induction Oxygen Delivery Method: Circle System Utilized Preoxygenation: Pre-oxygenation with 100% oxygen Induction Type: IV induction Ventilation: Mask ventilation without difficulty LMA: LMA inserted LMA Size: 3.0 Tube type: Oral Number of attempts: 1 Placement Confirmation: ETT inserted through vocal cords under direct vision,  positive ETCO2 and breath sounds checked- equal and bilateral Tube secured with: Tape Dental Injury: Teeth and Oropharynx as per pre-operative assessment  Comments: Smooth IV induction Rose-- LMA AM CRNA atraumatic-- mouth as preop--   bilat BS Rose

## 2017-12-08 NOTE — Op Note (Signed)
Date of procedure: 12/08/17  Preoperative diagnosis:  1. Phimosis  Postoperative diagnosis:  1. Phimosis  Procedure: 1. Circumcision  Surgeon: Baruch Gouty, MD  Anesthesia: General  Complications: None  Intraoperative findings: The patient had an unreducible phimosis.  He underwent successful circumcision.  EBL: None  Specimens: None  Drains: None  Disposition: Stable to the postanesthesia care unit  Indication for procedure: The patient is a 72 y.o. male with history of phimosis who is unable to skin presents today for elective circumcision.  After reviewing the management options for treatment, the patient elected to proceed with the above surgical procedure(s). We have discussed the potential benefits and risks of the procedure, side effects of the proposed treatment, the likelihood of the patient achieving the goals of the procedure, and any potential problems that might occur during the procedure or recuperation. Informed consent has been obtained.  Description of procedure: The patient was met in the preoperative area. All risks, benefits, and indications of the procedure were described in great detail. The patient consented to the procedure. Preoperative antibiotics were given. The patient was taken to the operative theater. General anesthesia was induced per the anesthesia service. The patient was then placed in the supine position and prepped and draped in the usual sterile fashion. A preoperative timeout was called.   The patient's foreskin was unable to reduce.  Therefore a dorsal slit was performed.  This involved placing a clamp via hemostat at the 12 o'clock position for approximately 2 minutes.  The client was then removed and then the area was incised with a pair of Metzenbaum scissors.  This made it possible to reduce the foreskin.  Of note, 10 cc of bupivacaine was placed without epinephrine prior to this for penile block.  Once the foreskin was then retracted, a  proximal and distal mark was made along the foreskin.  This penis was then incised circumferentially with a scalpel on these 2 incisions.  Using electrocautery, the foreskin was then excised.  Once the foreskin was excised, and hemostasis was obtained, attention then turned to reapproximate the skin edges.  This was done by placing suture with a 3-0 chromic at 12:00 and 6:00 3:00 and 9:00.  Once this reapproximation was completed, reapproximation then took place between the sutures with interrupted 3-0 chromic until the skin edges were well reapproximated.  The incision was then cleaned.  Is wrapped in Vaseline gauze and regular gauze.  The patient was then awoke from anesthesia and transferred in stable condition to postanesthesia care unit.  Plan: The patient will be discharged home today.  He will remove his gauze in 24-48 hours.  He wil not participate in any strenuous activity for at least 7 days.  Baruch Gouty, M.D.

## 2017-12-08 NOTE — Anesthesia Procedure Notes (Signed)
Date/Time: 12/08/2017 2:13 PM Performed by: Minerva EndsMirarchi, Mikell Camp M, CRNA Oxygen Delivery Method: Simple face mask Placement Confirmation: positive ETCO2 and breath sounds checked- equal and bilateral Dental Injury: Teeth and Oropharynx as per pre-operative assessment

## 2017-12-08 NOTE — Discharge Instructions (Signed)
Circumcision-Home Care Instructions  The following instructions have been prepared to help you care for yourself upon your return home today.   Wound Care & Hygiene:   You may apply ice to the penis.  This may help to decrease swelling.  Remove the dressing tomorrow.  If the dressing falls off before then, leave it off.  You may shower or bathe in 48 hours  Gently wash the penis with soap and water.  The stitches do not need to be removed.  Activity:  Do not drive or operate any equipment today.  The effects of anesthesia are still present, drowsiness may result.  Rest today, not necessarily flat bed rest, just take it easy.  You may resume your normal activity in one to two days or as indicated by your physician.  Sexual Activity:  Erection and sexual relations should be avoided for *2 weeks.  Return to Work:  One to two days or as indicated by your physician.  Diet:  Drink liquids or eat a very light diet this evening.  You may resume a regular diet tomorrow.  General Expectations of your surgery:   You may have a small amount of bleeding  The penis will be swollen and bruised for approximately one week  You may wake during the night with an erection, usually this is caused by having a full bladder so you should try to urinate (pass your water) to relieve the erection or apply ice to the penis  Unexpected Observations - Call your doctor if these occur!  Persistent or heavy bleeding  Temperature of 101 degrees or more  Severe pain not relieved by medication  Return to Office.  Call to schedule your appointment  Additional Instructions:          Post Anesthesia Home Care Instructions  Activity: Get plenty of rest for the remainder of the day. A responsible individual must stay with you for 24 hours following the procedure.  For the next 24 hours, DO NOT: -Drive a car -Advertising copywriterperate machinery -Drink alcoholic beverages -Take any medication unless instructed by your  physician -Make any legal decisions or sign important papers.  Meals: Start with liquid foods such as gelatin or soup. Progress to regular foods as tolerated. Avoid greasy, spicy, heavy foods. If nausea and/or vomiting occur, drink only clear liquids until the nausea and/or vomiting subsides. Call your physician if vomiting continues.  Special Instructions/Symptoms: Your throat may feel dry or sore from the anesthesia or the breathing tube placed in your throat during surgery. If this causes discomfort, gargle with warm salt water. The discomfort should disappear within 24 hours.  If you had a scopolamine patch placed behind your ear for the management of post- operative nausea and/or vomiting:  1. The medication in the patch is effective for 72 hours, after which it should be removed.  Wrap patch in a tissue and discard in the trash. Wash hands thoroughly with soap and water. 2. You may remove the patch earlier than 72 hours if you experience unpleasant side effects which may include dry mouth, dizziness or visual disturbances. 3. Avoid touching the patch. Wash your hands with soap and water after contact with the patch.      [facility] [address], [place], [place] [address] Tel: [phone]

## 2017-12-08 NOTE — Transfer of Care (Signed)
Immediate Anesthesia Transfer of Care Note  Patient: Jerry [name]  Procedure(s) Performed: CIRCUMCISION ADULT (N/A )  Patient Location: PACU  Anesthesia Type:General  Level of Consciousness: sedated  Airway & Oxygen Therapy: Patient Spontanous Breathing and Patient connected to face mask oxygen  Post-op Assessment: Report given to RN and Post -op Vital signs reviewed and stable  Post vital signs: Reviewed and stable  Last Vitals:  Vitals:   12/08/17 1019  BP: (!) 149/96  Pulse: 70  Resp: 18  Temp: 36.6 C  SpO2: 99%    Last Pain:  Vitals:   12/08/17 1044  TempSrc:   PainSc: 5       Patients Stated Pain Goal: 5 (12/08/17 1044)  Complications: No apparent anesthesia complications

## 2017-12-09 ENCOUNTER — Encounter (HOSPITAL_BASED_OUTPATIENT_CLINIC_OR_DEPARTMENT_OTHER): Payer: Self-pay | Admitting: Urology

## 2017-12-18 DIAGNOSIS — N471 Phimosis: Secondary | ICD-10-CM | POA: Diagnosis not present

## 2018-01-07 DIAGNOSIS — N471 Phimosis: Secondary | ICD-10-CM | POA: Diagnosis not present

## 2018-05-20 ENCOUNTER — Encounter (HOSPITAL_COMMUNITY): Payer: Self-pay | Admitting: Emergency Medicine

## 2018-05-20 ENCOUNTER — Ambulatory Visit (HOSPITAL_COMMUNITY)
Admission: EM | Admit: 2018-05-20 | Discharge: 2018-05-20 | Disposition: A | Discharge status: Home / Self Care | Payer: Medicare Other | Attending: Family Medicine | Admitting: Family Medicine

## 2018-05-20 ENCOUNTER — Other Ambulatory Visit: Payer: Self-pay

## 2018-05-20 DIAGNOSIS — L03116 Cellulitis of left lower limb: Secondary | ICD-10-CM | POA: Diagnosis not present

## 2018-05-20 DIAGNOSIS — M25572 Pain in left ankle and joints of left foot: Secondary | ICD-10-CM | POA: Diagnosis not present

## 2018-05-20 DIAGNOSIS — Z23 Encounter for immunization: Secondary | ICD-10-CM

## 2018-05-20 MED ORDER — TETANUS-DIPHTH-ACELL PERTUSSIS 5-2.5-18.5 LF-MCG/0.5 IM SUSP
0.5000 mL | Freq: Once | INTRAMUSCULAR | Status: AC
  Administered 2018-05-20: 0.5 mL via INTRAMUSCULAR

## 2018-05-20 MED ORDER — TETANUS-DIPHTHERIA TOXOIDS TD 5-2 LFU IM INJ: 0.5000 mL | INJECTION | Freq: Once | INTRAMUSCULAR | Status: DC

## 2018-05-20 MED ORDER — TETANUS-DIPHTH-ACELL PERTUSSIS 5-2.5-18.5 LF-MCG/0.5 IM SUSP
INTRAMUSCULAR | Status: AC
  Filled 2018-05-20: qty 0.5

## 2018-05-20 MED ORDER — CEPHALEXIN 500 MG PO CAPS: 500.0000 mg | ORAL_CAPSULE | Freq: Three times a day (TID) | ORAL | 0 refills | Status: DC

## 2018-05-20 NOTE — Discharge Instructions (Addendum)
Keep the area covered and don't wear those shoes anymore. The area can't heal if there is continued friction on it.   Soak the wound in salt water twice daily and apply antibiotic cream after this twice daily. Take keflex (antibiotic, which is less likely to cause diarrhea) three times daily for 7 days.  Return to urgent care or your primary doctor if the wound gets bigger, starts smelling bad, starts hurting more, doesn't heal, or you develop a fever.

## 2018-05-20 NOTE — ED Provider Notes (Addendum)
[facility]    CSN: [id number] Arrival date & time: 05/20/18  1191     History   Chief Complaint Chief Complaint  Patient presents with  . Ankle Pain    HPI Jerry [name] is a 72 y.o. male with limited medical follow up and no known chronic medical conditions other than recent phimosis requiring circumcision who presents with 1 week of worsening wound to the left medial ankle. He just started wearing different tennis shoes and walking more, noticing abrasion on the left ankle gradually worsening with associated moderate pain improved with tylenol. He's been applying antibiotic cream daily and feels like the area is getting smaller. Still wearing the same shoes. He is not up to date on tetatanus vaccination (not sure he's had 3 vaccinations and knows the most recent was >10 years ago).  HPI  Past Medical History:  Diagnosis Date  . Arthritis   . Phimosis     There are no active problems to display for this patient.   Past Surgical History:  Procedure Laterality Date  . APPENDECTOMY  age 62  . CIRCUMCISION N/A 12/08/2017   Procedure: CIRCUMCISION ADULT;  Surgeon: Hildred Laser, MD;  Location: [facility];  Service: Urology;  Laterality: N/A;  . INGUINAL HERNIA REPAIR Bilateral 05/05/2015   Procedure: LAPAROSCOPIC BILATERAL INGUINAL HERNIA REPAIRS WITH MESH;  Surgeon: Axel Filler, MD;  Location: [facility];  Service: General;  Laterality: Bilateral;  . INSERTION OF MESH Bilateral 05/05/2015   Procedure: INSERTION OF MESH;  Surgeon: Axel Filler, MD;  Location: [facility];  Service: General;  Laterality: Bilateral;       Home Medications    Prior to Admission medications   Medication Sig Start Date End Date Taking? Authorizing Provider  acetaminophen (TYLENOL) 325 MG tablet Take 650 mg by mouth every 6 (six) hours as needed.   Yes [provider]  cephALEXin (KEFLEX) 500 MG capsule Take 1 capsule (500 mg total) by mouth 3 (three) times  daily. 05/20/18   Tyrone Nine, MD    Family History History reviewed. No pertinent family history.  Social History Social History   Tobacco Use  . Smoking status: Never Smoker  . Smokeless tobacco: Never Used  Substance Use Topics  . Alcohol use: No    Alcohol/week: 0.0 oz  . Drug use: No     Allergies   Patient has no known allergies.   Review of Systems Review of Systems No fevers, chills, limited mobility.   Physical Exam Triage Vital Signs ED Triage Vitals  Enc Vitals Group     BP 05/20/18 1010 (!) 159/93     Pulse Rate 05/20/18 1010 72     Resp --      Temp 05/20/18 1010 98 F (36.7 C)     Temp Source 05/20/18 1010 Oral     SpO2 05/20/18 1010 98 %     Weight --      Height --      Head Circumference --      Peak Flow --      Pain Score 05/20/18 1014 7     Pain Loc --      Pain Edu? --      Excl. in GC? --    No data found.  Updated Vital Signs BP (!) 159/93 (BP Location: Left Arm)   Pulse 72   Temp 98 F (36.7 C) (Oral)   SpO2 98%   Visual Acuity  Physical Exam Gen:  Nontoxic, disheveled-appearing 71 y.o.male in NAD HEENT: MMM, posterior oropharynx clear Pulm: Non-labored; CTAB, no wheezes  CV: Regular rate, no murmur appreciated; distal pulses intact/symmetric GI: + BS; soft, non-tender, non-distended Skin: Posterior to medial malleolus on left ankle with shallow abrasion and light colored slough approximately 1.5in x 1.5in annular with surrounding pink erythema without palpable induration or fluctuance. No tenderness to palpation at ankle, no restriction in ROM Neuro: A&Ox3, CN II-XII without deficits     UC Treatments / Results  Labs (all labs ordered are listed, but only abnormal results are displayed) Labs Reviewed - No data to display  EKG None  Radiology No results found.  Procedures Procedures (including critical care time)  Medications Ordered in UC Medications  tetanus & diphtheria toxoids (adult) (TENIVAC) injection  0.5 mL (has no administration in time range)    Initial Impression / Assessment and Plan / UC Course  I have reviewed the triage vital signs and the nursing notes.  Pertinent labs & imaging results that were available during my care of the patient were reviewed by me and considered in my medical decision making (see chart for details).    Beginning of nonpurulent cellulitis due to friction injury. Directions as below given to patient. Keflex x7 days, topical abx and typical wound care discussed. Update tetanus/diphtheria. Return precautions provided.   Final Clinical Impressions(s) / UC Diagnoses   Final diagnoses:  Cellulitis of left ankle     Discharge Instructions     Keep the area covered and don't wear those shoes anymore. The area can't heal if there is continued friction on it.   Soak the wound in salt water twice daily and apply antibiotic cream after this twice daily. Take keflex (antibiotic, which is less likely to cause diarrhea) three times daily for 7 days.  Return to urgent care or your primary doctor if the wound gets bigger, starts smelling bad, starts hurting more, doesn't heal, or you develop a fever.    ED Prescriptions    Medication Sig Dispense Auth. Provider   cephALEXin (KEFLEX) 500 MG capsule Take 1 capsule (500 mg total) by mouth 3 (three) times daily. 21 capsule Tyrone NineGrunz, Lataysha Vohra B, MD        Tyrone NineGrunz, Aldred Mase B, MD 05/20/18 1034    Tyrone NineGrunz, Ellenie Salome B, MD 05/20/18 [phone]

## 2018-05-20 NOTE — ED Triage Notes (Signed)
Left ankle pain.  Patient says he wore a different pair of shoes recently.  There is a sore under ankle .  redness and swelling present and foul odor.  Wound is draining

## 2018-08-16 DIAGNOSIS — Z23 Encounter for immunization: Secondary | ICD-10-CM | POA: Diagnosis not present

## 2019-07-06 DIAGNOSIS — Z23 Encounter for immunization: Secondary | ICD-10-CM | POA: Diagnosis not present

## 2019-10-06 DIAGNOSIS — H16143 Punctate keratitis, bilateral: Secondary | ICD-10-CM | POA: Diagnosis not present

## 2020-07-08 DIAGNOSIS — Z23 Encounter for immunization: Secondary | ICD-10-CM | POA: Diagnosis not present

## 2020-11-04 DIAGNOSIS — Z23 Encounter for immunization: Secondary | ICD-10-CM | POA: Diagnosis not present

## 2021-06-20 ENCOUNTER — Ambulatory Visit (HOSPITAL_COMMUNITY)
Admission: EM | Admit: 2021-06-20 | Discharge: 2021-06-20 | Disposition: A | Discharge status: Home / Self Care | Payer: Medicare Other | Attending: Student | Admitting: Student

## 2021-06-20 ENCOUNTER — Encounter (HOSPITAL_COMMUNITY): Payer: Self-pay

## 2021-06-20 ENCOUNTER — Other Ambulatory Visit: Payer: Self-pay

## 2021-06-20 DIAGNOSIS — L02415 Cutaneous abscess of right lower limb: Secondary | ICD-10-CM

## 2021-06-20 MED ORDER — LIDOCAINE HCL (PF) 1 % IJ SOLN
INTRAMUSCULAR | Status: AC
  Filled 2021-06-20: qty 2

## 2021-06-20 MED ORDER — DOXYCYCLINE HYCLATE 100 MG PO CAPS: 100.0000 mg | ORAL_CAPSULE | Freq: Two times a day (BID) | ORAL | 0 refills | Status: AC

## 2021-06-20 MED ORDER — CEFTRIAXONE SODIUM 500 MG IJ SOLR
500.0000 mg | Freq: Once | INTRAMUSCULAR | Status: AC
  Administered 2021-06-20: 500 mg via INTRAMUSCULAR

## 2021-06-20 MED ORDER — CEFTRIAXONE SODIUM 500 MG IJ SOLR
INTRAMUSCULAR | Status: AC
  Filled 2021-06-20: qty 500

## 2021-06-20 NOTE — Discharge Instructions (Addendum)
-  Doxycycline twice daily for 7 days.  Make sure to wear sunscreen while spending time outside while on this medication as it can increase your chance of sunburn. You can take this medication with food if you have a sensitive stomach. -Wash your wound with gentle soap and water 1-2 times daily.  Let air dry or gently pat. Avoid hydrogen peroxide or alcohol -Mark the redness on your leg with a permanent marker or similar. Keep a close eye on this. After 24 hours of antibiotic, the redness should stop growing. It the redness gets >1 inch bigger after you've been on the antibiotic for 24 hours, head to the ED for possible IV antibiotics.  -If your knee pain gets worse, or the redness increases in size, or new fevers/chills, etc- head to the ED

## 2021-06-20 NOTE — ED Triage Notes (Signed)
Pt reports right thing and right knee swelling and pain x 4-5 days. Reports having a knot that is getting bigger.

## 2021-06-20 NOTE — ED Provider Notes (Signed)
[facility]    CSN: [id number] Arrival date & time: 06/20/21  1214      History   Chief Complaint Chief Complaint  Patient presents with   Knee Problem    HPI Jerry [name] is a 76 y.o. male presenting with abscess right knee.  Medical history noncontributory.  Presenting with abscess to the anterior right knee for about 1 week.  Patient states this is draining on its own.  Denies pain with any movement, denies fever/chills.  HPI  Past Medical History:  Diagnosis Date   Arthritis    Phimosis     There are no problems to display for this patient.   Past Surgical History:  Procedure Laterality Date   APPENDECTOMY  age 89   CIRCUMCISION N/A 12/08/2017   Procedure: CIRCUMCISION ADULT;  Surgeon: Hildred Laser, MD;  Location: [facility];  Service: Urology;  Laterality: N/A;   INGUINAL HERNIA REPAIR Bilateral 05/05/2015   Procedure: LAPAROSCOPIC BILATERAL INGUINAL HERNIA REPAIRS WITH MESH;  Surgeon: Axel Filler, MD;  Location: [facility];  Service: General;  Laterality: Bilateral;   INSERTION OF MESH Bilateral 05/05/2015   Procedure: INSERTION OF MESH;  Surgeon: Axel Filler, MD;  Location: [facility];  Service: General;  Laterality: Bilateral;       Home Medications    Prior to Admission medications   Medication Sig Start Date End Date Taking? Authorizing Provider  doxycycline (VIBRAMYCIN) 100 MG capsule Take 1 capsule (100 mg total) by mouth 2 (two) times daily for 7 days. 06/20/21 06/27/21 Yes Rhys Martini, PA-C  acetaminophen (TYLENOL) 325 MG tablet Take 650 mg by mouth every 6 (six) hours as needed.    [provider]    Family History History reviewed. No pertinent family history.  Social History Social History   Tobacco Use   Smoking status: Never   Smokeless tobacco: Never  Vaping Use   Vaping Use: Never used  Substance Use Topics   Alcohol use: No    Alcohol/week: 0.0 standard drinks   Drug use: No      Allergies   Patient has no known allergies.   Review of Systems Review of Systems  Skin:  Positive for color change.    Physical Exam Triage Vital Signs ED Triage Vitals  Enc Vitals Group     BP 06/20/21 1230 125/88     Pulse Rate 06/20/21 1230 88     Resp 06/20/21 1230 (!) 28     Temp 06/20/21 1230 99.1 F (37.3 C)     Temp Source 06/20/21 1230 Oral     SpO2 06/20/21 1230 98 %     Weight --      Height --      Head Circumference --      Peak Flow --      Pain Score 06/20/21 1228 6     Pain Loc --      Pain Edu? --      Excl. in GC? --    No data found.  Updated Vital Signs BP 125/88 (BP Location: Right Arm)   Pulse 88   Temp 99.1 F (37.3 C) (Oral)   Resp (!) 28   SpO2 98%   Visual Acuity Right Eye Distance:   Left Eye Distance:   Bilateral Distance:    Right Eye Near:   Left Eye Near:    Bilateral Near:     Physical Exam Vitals reviewed.  Constitutional:      General:  He is not in acute distress.    Appearance: Normal appearance. He is not ill-appearing or diaphoretic.  HENT:     Head: Normocephalic and atraumatic.  Cardiovascular:     Rate and Rhythm: Normal rate and regular rhythm.     Heart sounds: Normal heart sounds.  Pulmonary:     Effort: Pulmonary effort is normal.     Breath sounds: Normal breath sounds.  Skin:    General: Skin is warm.     Comments: See image below R knee with 3x3cm area of induration erythema tenderness warmth, draining on its own. No drainage expressed given patient discomfort. ROM knee intact and without pain.  Neurological:     General: No focal deficit present.     Mental Status: He is alert and oriented to person, place, and time.  Psychiatric:        Mood and Affect: Mood normal.        Behavior: Behavior normal.        Thought Content: Thought content normal.        Judgment: Judgment normal.       UC Treatments / Results  Labs (all labs ordered are listed, but only abnormal results are  displayed) Labs Reviewed - No data to display  EKG   Radiology No results found.  Procedures Procedures (including critical care time)  Medications Ordered in UC Medications  cefTRIAXone (ROCEPHIN) injection 500 mg (has no administration in time range)    Initial Impression / Assessment and Plan / UC Course  I have reviewed the triage vital signs and the nursing notes.  Pertinent labs & imaging results that were available during my care of the patient were reviewed by me and considered in my medical decision making (see chart for details).     This patient is a very pleasant 75 y.o. year old male presenting with abscess R knee. Afebrile, nontachy.  Abscess is spontaneously draining. Rocephin administered at time of visit. Doxycycline sent. Wound care discussed. STRICT ED return precautions discussed. Patient verbalizes understanding and agreement.    Final Clinical Impressions(s) / UC Diagnoses   Final diagnoses:  Abscess of knee, right     Discharge Instructions      -Doxycycline twice daily for 7 days.  Make sure to wear sunscreen while spending time outside while on this medication as it can increase your chance of sunburn. You can take this medication with food if you have a sensitive stomach. -Wash your wound with gentle soap and water 1-2 times daily.  Let air dry or gently pat. Avoid hydrogen peroxide or alcohol -Mark the redness on your leg with a permanent marker or similar. Keep a close eye on this. After 24 hours of antibiotic, the redness should stop growing. It the redness gets >1 inch bigger after you've been on the antibiotic for 24 hours, head to the ED for possible IV antibiotics.  -If your knee pain gets worse, or the redness increases in size, or new fevers/chills, etc- head to the ED     ED Prescriptions     Medication Sig Dispense Auth. Provider   doxycycline (VIBRAMYCIN) 100 MG capsule Take 1 capsule (100 mg total) by mouth 2 (two) times daily  for 7 days. 14 capsule Rhys Martini, PA-C      PDMP not reviewed this encounter.   Rhys Martini, PA-C 06/20/21 1312

## 2021-07-22 DIAGNOSIS — Z23 Encounter for immunization: Secondary | ICD-10-CM | POA: Diagnosis not present

## 2021-11-08 ENCOUNTER — Other Ambulatory Visit: Payer: Self-pay

## 2021-11-08 DIAGNOSIS — W010XXA Fall on same level from slipping, tripping and stumbling without subsequent striking against object, initial encounter: Secondary | ICD-10-CM | POA: Diagnosis not present

## 2021-11-08 DIAGNOSIS — M25461 Effusion, right knee: Secondary | ICD-10-CM | POA: Diagnosis not present

## 2021-11-08 DIAGNOSIS — S80212A Abrasion, left knee, initial encounter: Secondary | ICD-10-CM | POA: Insufficient documentation

## 2021-11-08 DIAGNOSIS — I739 Peripheral vascular disease, unspecified: Secondary | ICD-10-CM | POA: Diagnosis not present

## 2021-11-08 DIAGNOSIS — Y9301 Activity, walking, marching and hiking: Secondary | ICD-10-CM | POA: Insufficient documentation

## 2021-11-08 DIAGNOSIS — M25561 Pain in right knee: Secondary | ICD-10-CM | POA: Insufficient documentation

## 2021-11-08 DIAGNOSIS — S80211A Abrasion, right knee, initial encounter: Secondary | ICD-10-CM | POA: Diagnosis not present

## 2021-11-08 DIAGNOSIS — S8991XA Unspecified injury of right lower leg, initial encounter: Secondary | ICD-10-CM | POA: Diagnosis not present

## 2021-11-08 DIAGNOSIS — M1711 Unilateral primary osteoarthritis, right knee: Secondary | ICD-10-CM | POA: Diagnosis not present

## 2021-11-09 ENCOUNTER — Other Ambulatory Visit: Payer: Self-pay

## 2021-11-09 ENCOUNTER — Emergency Department (HOSPITAL_COMMUNITY): Payer: Medicare Other

## 2021-11-09 ENCOUNTER — Emergency Department (HOSPITAL_COMMUNITY)
Admission: EM | Admit: 2021-11-09 | Discharge: 2021-11-09 | Disposition: A | Discharge status: Home / Self Care | Payer: Medicare Other | Attending: Emergency Medicine | Admitting: Emergency Medicine

## 2021-11-09 ENCOUNTER — Encounter (HOSPITAL_COMMUNITY): Payer: Self-pay

## 2021-11-09 DIAGNOSIS — M25561 Pain in right knee: Secondary | ICD-10-CM

## 2021-11-09 DIAGNOSIS — M1711 Unilateral primary osteoarthritis, right knee: Secondary | ICD-10-CM | POA: Diagnosis not present

## 2021-11-09 DIAGNOSIS — W19XXXA Unspecified fall, initial encounter: Secondary | ICD-10-CM

## 2021-11-09 DIAGNOSIS — I739 Peripheral vascular disease, unspecified: Secondary | ICD-10-CM | POA: Diagnosis not present

## 2021-11-09 DIAGNOSIS — S80211A Abrasion, right knee, initial encounter: Secondary | ICD-10-CM | POA: Diagnosis not present

## 2021-11-09 DIAGNOSIS — M25461 Effusion, right knee: Secondary | ICD-10-CM | POA: Diagnosis not present

## 2021-11-09 DIAGNOSIS — S8991XA Unspecified injury of right lower leg, initial encounter: Secondary | ICD-10-CM | POA: Diagnosis not present

## 2021-11-09 MED ORDER — ACETAMINOPHEN 325 MG PO TABS
650.0000 mg | ORAL_TABLET | Freq: Once | ORAL | Status: AC
  Administered 2021-11-09: 08:00:00 650 mg via ORAL
  Filled 2021-11-09: qty 2

## 2021-11-09 NOTE — ED Provider Notes (Signed)
[facility] EMERGENCY DEPARTMENT Provider Note   CSN: [id number] Arrival date & time: 11/08/21  2346     History Chief Complaint  Patient presents with   Fall   Leg Injury    Tytan Sandate is a 75 y.o. male.  HPI Patient presents with a male companion who assists with the history.  He presents due to a fall that occurred yesterday.  He notes that he was in his usual state of health, fell while walking down wet hill side.  Since that time he has had pain focally in the right knee, posteriorly, described as sore, severe, worse with ambulation or motion.  He has not, seemingly, taken any medication for relief.  Several times he denies any hip pain or ankle pain either with motion or at rest.  He denies head trauma, or other complaints.    Past Medical History:  Diagnosis Date   Arthritis    Phimosis     There are no problems to display for this patient.   Past Surgical History:  Procedure Laterality Date   APPENDECTOMY  age 74   CIRCUMCISION N/A 12/08/2017   Procedure: CIRCUMCISION ADULT;  Surgeon: Hildred Laser, MD;  Location: [facility];  Service: Urology;  Laterality: N/A;   INGUINAL HERNIA REPAIR Bilateral 05/05/2015   Procedure: LAPAROSCOPIC BILATERAL INGUINAL HERNIA REPAIRS WITH MESH;  Surgeon: Axel Filler, MD;  Location: [facility];  Service: General;  Laterality: Bilateral;   INSERTION OF MESH Bilateral 05/05/2015   Procedure: INSERTION OF MESH;  Surgeon: Axel Filler, MD;  Location: [facility];  Service: General;  Laterality: Bilateral;       History reviewed. No pertinent family history.  Social History   Tobacco Use   Smoking status: Never   Smokeless tobacco: Never  Vaping Use   Vaping Use: Never used  Substance Use Topics   Alcohol use: No    Alcohol/week: 0.0 standard drinks   Drug use: No    Home Medications Prior to Admission medications   Medication Sig Start Date End Date Taking? Authorizing Provider   acetaminophen (TYLENOL) 325 MG tablet Take 650 mg by mouth every 6 (six) hours as needed.    [provider]    Allergies    Patient has no known allergies.  Review of Systems   Review of Systems  Constitutional:        Per HPI, otherwise negative  HENT:         Per HPI, otherwise negative  Respiratory:         Per HPI, otherwise negative  Cardiovascular:        Per HPI, otherwise negative  Gastrointestinal:  Negative for vomiting.  Endocrine:       Negative aside from HPI  Genitourinary:        Neg aside from HPI   Musculoskeletal:        Per HPI, otherwise negative  Skin: Negative.   Neurological:  Negative for syncope.   Physical Exam Updated Vital Signs BP (!) 168/91 (BP Location: Right Arm)    Pulse 74    Temp 98.1 F (36.7 C)    Resp 15    Ht 5\' 8"  (1.727 m)    Wt 68 kg    SpO2 97%    BMI 22.81 kg/m   Physical Exam Vitals and nursing note reviewed.  Constitutional:      General: He is not in acute distress.    Appearance: He is well-developed.  Comments: Disheveled adult male awake and alert  HENT:     Head: Normocephalic and atraumatic.  Eyes:     Conjunctiva/sclera: Conjunctivae normal.  Cardiovascular:     Rate and Rhythm: Normal rate and regular rhythm.  Pulmonary:     Effort: Pulmonary effort is normal. No respiratory distress.     Breath sounds: No stridor.  Abdominal:     General: There is no distension.  Musculoskeletal:       Legs:  Skin:    General: Skin is warm and dry.     Comments: Superficial abrasions and scratches on both knees  Neurological:     Mental Status: He is alert and oriented to person, place, and time.    ED Results / Procedures / Treatments   Labs (all labs ordered are listed, but only abnormal results are displayed) Labs Reviewed - No data to display  EKG None  Radiology XR and CT reviewed  Procedures Procedures   Medications Ordered in ED Medications  acetaminophen (TYLENOL) tablet 650 mg (650  mg Oral Given 11/09/21 0813)    ED Course  I have reviewed the triage vital signs and the nursing notes.  Pertinent labs & imaging results that were available during my care of the patient were reviewed by me and considered in my medical decision making (see chart for details).  On repeat exam patient continues to c/o pain, reiterates none in the hip nor ankle. CT ordered w consideration of tibial plateau Fx.  CT reviewed, no Fx.  Patient HD stable, no other complaints.  W pain s/p fall, low suspicion for DVT.  No c/o pain in hip or ankle and the patient can ambulate. Final Clinical Impression(s) / ED Diagnoses Final diagnoses:  Fall, initial encounter  Acute pain of right knee      Gerhard Munch, MD 11/12/21 2007

## 2021-11-09 NOTE — Discharge Instructions (Signed)
As discussed, you likely sustained a soft tissue injury to your knee, such as a sprain or strain.  Please rest, use Tylenol, and ibuprofen for pain control.  If develop new or concerning changes, do not hesitate to return here or follow-up with our orthopedic surgery colleagues.

## 2021-11-09 NOTE — ED Triage Notes (Signed)
Pt reports he slipped and fell on some leaves. Pt c/o right leg pain.

## 2021-11-09 NOTE — ED Provider Notes (Signed)
Emergency Medicine Provider Triage Evaluation Note  Masaichi Kracht , a 75 y.o. male  was evaluated in triage.  Pt complains of severe pain in the right knee after fall earlier this afternoon.  Patient reports he slipped on some leaves and fell striking his right knee.  Reports has had trouble walking since that time.  Does have a history of peripheral neuropathy.  Denies new or worsening pain in his hips or back.  Denies new or worsening paresthesias.  Denies loss of bowel or bladder control.  Patient walks with a cane at baseline.  Review of Systems  Positive: Right knee pain and swelling Negative: Back pain, fever, chills  Physical Exam  BP (!) 162/105    Pulse 89    Temp 98.3 F (36.8 C) (Oral)    Resp 18    Ht 5\' 8"  (1.727 m)    Wt 68 kg    SpO2 93%    BMI 22.81 kg/m  Gen:   Awake, no distress   Resp:  Normal effort  MSK:   Right knee with significant swelling, tenderness to palpation throughout.  Palpable joint effusion.  Decreased range of motion. Other:  Difficulty walking due to right knee pain.  Medical Decision Making  Medically screening exam initiated at 12:44 AM.  Appropriate orders placed.  Aidin Doane was informed that the remainder of the evaluation will be completed by another provider, this initial triage assessment does not replace that evaluation, and the importance of remaining in the ED until their evaluation is complete.  Right knee pain after fall.   Floria Raveling, Dierdre Forth 11/09/21 11/11/21    [id number], MD 11/09/21 [phone]

## 2022-04-21 ENCOUNTER — Emergency Department (HOSPITAL_COMMUNITY)
Admission: EM | Admit: 2022-04-21 | Discharge: 2022-04-21 | Disposition: A | Discharge status: Home / Self Care | Payer: Medicare Other | Attending: Emergency Medicine | Admitting: Emergency Medicine

## 2022-04-21 ENCOUNTER — Encounter (HOSPITAL_COMMUNITY): Payer: Self-pay | Admitting: Emergency Medicine

## 2022-04-21 ENCOUNTER — Ambulatory Visit (HOSPITAL_COMMUNITY)
Admission: EM | Admit: 2022-04-21 | Discharge: 2022-04-21 | Disposition: A | Discharge status: Home / Self Care | Payer: Medicare Other

## 2022-04-21 ENCOUNTER — Other Ambulatory Visit: Payer: Self-pay

## 2022-04-21 DIAGNOSIS — R030 Elevated blood-pressure reading, without diagnosis of hypertension: Secondary | ICD-10-CM | POA: Diagnosis not present

## 2022-04-21 DIAGNOSIS — L02512 Cutaneous abscess of left hand: Secondary | ICD-10-CM | POA: Insufficient documentation

## 2022-04-21 DIAGNOSIS — I160 Hypertensive urgency: Secondary | ICD-10-CM | POA: Diagnosis not present

## 2022-04-21 DIAGNOSIS — L0291 Cutaneous abscess, unspecified: Secondary | ICD-10-CM

## 2022-04-21 MED ORDER — LIDOCAINE-EPINEPHRINE (PF) 2 %-1:200000 IJ SOLN
10.0000 mL | Freq: Once | INTRAMUSCULAR | Status: AC
  Administered 2022-04-21: 10 mL via INTRADERMAL
  Filled 2022-04-21: qty 20

## 2022-04-21 MED ORDER — SULFAMETHOXAZOLE-TRIMETHOPRIM 800-160 MG PO TABS
1.0000 | ORAL_TABLET | Freq: Once | ORAL | Status: AC
  Administered 2022-04-21: 1 via ORAL
  Filled 2022-04-21: qty 1

## 2022-04-21 MED ORDER — SULFAMETHOXAZOLE-TRIMETHOPRIM 800-160 MG PO TABS: 1.0000 | ORAL_TABLET | Freq: Two times a day (BID) | ORAL | 0 refills | Status: AC

## 2022-04-21 NOTE — ED Triage Notes (Signed)
Left hand swelling , redness and reportedly extreme pain started last Monday.  Patient has brisk cap refill.  Bump to back of left hand at base of ring and middle finger.  Entire hand and wrist is swollen.  Patient is able to move fingers.  White tip to bump, oozing liquid

## 2022-04-21 NOTE — ED Provider Notes (Signed)
[facility] EMERGENCY DEPARTMENT Provider Note   CSN: [id number] Arrival date & time: 04/21/22  1038     History  Chief Complaint  Patient presents with   Abscess    Jerry [name] is a 76 y.o. male.  76 yo M with a chief complaint of an abscess to the dorsum of the left hand.  Going on for a few days now.  Getting rapidly worse. Some drainage from the area.  Denies obvious foreign body denies insect bites or sting.  Denies snakebite.  Went to urgent care and they were concerned about his blood pressure and sent him here for evaluation.   Abscess     Home Medications Prior to Admission medications   Medication Sig Start Date End Date Taking? Authorizing Provider  sulfamethoxazole-trimethoprim (BACTRIM DS) 800-160 MG tablet Take 1 tablet by mouth 2 (two) times daily for 7 days. 04/21/22 04/28/22 Yes Melene Plan, DO  acetaminophen (TYLENOL) 325 MG tablet Take 650 mg by mouth every 6 (six) hours as needed.    [provider]      Allergies    Patient has no known allergies.    Review of Systems   Review of Systems  Physical Exam Updated Vital Signs BP (!) 167/107   Pulse 76   Temp 99 F (37.2 C) (Oral)   Resp 16   SpO2 100%  Physical Exam Vitals and nursing note reviewed.  Constitutional:      Appearance: He is well-developed.  HENT:     Head: Normocephalic and atraumatic.  Eyes:     Pupils: Pupils are equal, round, and reactive to light.  Neck:     Vascular: No JVD.  Cardiovascular:     Rate and Rhythm: Normal rate and regular rhythm.     Heart sounds: No murmur heard.   No friction rub. No gallop.  Pulmonary:     Effort: No respiratory distress.     Breath sounds: No wheezing.  Abdominal:     General: There is no distension.     Tenderness: There is no abdominal tenderness. There is no guarding or rebound.  Musculoskeletal:        General: Swelling and tenderness present. Normal range of motion.     Cervical back: Normal range of  motion and neck supple.     Comments: Pain and swelling to the dorsum of the left hand with a erythematous area with fluctuance and old appearing drainage.  Skin:    Coloration: Skin is not pale.     Findings: No rash.  Neurological:     Mental Status: He is alert and oriented to person, place, and time.  Psychiatric:        Behavior: Behavior normal.    ED Results / Procedures / Treatments   Labs (all labs ordered are listed, but only abnormal results are displayed) Labs Reviewed - No data to display  EKG None  Radiology No results found.  Procedures .Marland KitchenIncision and Drainage  Date/Time: 04/21/2022 3:08 PM Performed by: Melene Plan, DO Authorized by: Melene Plan, DO   Consent:    Consent obtained:  Verbal   Consent given by:  Patient   Risks, benefits, and alternatives were discussed: yes     Risks discussed:  Bleeding, damage to other organs, incomplete drainage and infection   Alternatives discussed:  No treatment, delayed treatment and alternative treatment Universal protocol:    Procedure explained and questions answered to patient or proxy's satisfaction: yes  Immediately prior to procedure, a time out was called: yes     Patient identity confirmed:  Verbally with patient Location:    Type:  Abscess   Size:  Quarter   Location:  Upper extremity   Upper extremity location:  Hand   Hand location:  L hand Pre-procedure details:    Skin preparation:  Chlorhexidine Sedation:    Sedation type:  None Anesthesia:    Anesthesia method:  Local infiltration   Local anesthetic:  Lidocaine 2% WITH epi Procedure type:    Complexity:  Complex Procedure details:    Ultrasound guidance: no     Needle aspiration: no     Incision types:  Single straight   Incision depth:  Subcutaneous   Wound management:  Probed and deloculated   Drainage:  Bloody and purulent   Drainage amount:  Copious   Wound treatment:  Wound left open   Packing materials:  None Post-procedure  details:    Procedure completion:  Tolerated well, no immediate complications    Medications Ordered in ED Medications  lidocaine-EPINEPHrine (XYLOCAINE W/EPI) 2 %-1:200000 (PF) injection 10 mL (10 mLs Intradermal Given by Other 04/21/22 1353)  sulfamethoxazole-trimethoprim (BACTRIM DS) 800-160 MG per tablet 1 tablet (1 tablet Oral Given 04/21/22 1353)    ED Course/ Medical Decision Making/ A&P                           Medical Decision Making Risk Prescription drug management.   76 yo M with a chief complaint of left hand pain.  This been going on for a few days now.  Started as a pimple and is gotten rapidly worse.  Clinically patient has an abscess.  He was sent from urgent care as his blood pressure was elevated.  I suspect the blood pressure is likely elevated secondary to his discomfort.  He denies any other concerning symptom.  Will perform I&D at bedside.  Started on oral antibiotics.  PCP follow-up.  3:09 PM:  I have discussed the diagnosis/risks/treatment options with the patient and family.  Evaluation and diagnostic testing in the emergency department does not suggest an emergent condition requiring admission or immediate intervention beyond what has been performed at this time.  They will follow up with  PCP. We also discussed returning to the ED immediately if new or worsening sx occur. We discussed the sx which are most concerning (e.g., sudden worsening pain, fever, inability to tolerate by mouth) that necessitate immediate return. Medications administered to the patient during their visit and any new prescriptions provided to the patient are listed below.  Medications given during this visit Medications  lidocaine-EPINEPHrine (XYLOCAINE W/EPI) 2 %-1:200000 (PF) injection 10 mL (10 mLs Intradermal Given by Other 04/21/22 1353)  sulfamethoxazole-trimethoprim (BACTRIM DS) 800-160 MG per tablet 1 tablet (1 tablet Oral Given 04/21/22 1353)     The patient appears reasonably screen  and/or stabilized for discharge and I doubt any other medical condition or other [facility] requiring further screening, evaluation, or treatment in the ED at this time prior to discharge.         Final Clinical Impression(s) / ED Diagnoses Final diagnoses:  Abscess    Rx / DC Orders ED Discharge Orders          Ordered    sulfamethoxazole-trimethoprim (BACTRIM DS) 800-160 MG tablet  2 times daily        04/21/22 1503  Melene PlanFloyd, Raushanah Osmundson, DO 04/21/22 [phone]

## 2022-04-21 NOTE — ED Notes (Signed)
Patient is being discharged from the Urgent Care and sent to the Emergency Department via pov . Per Ignacia Bayley, PA, patient is in need of higher level of care due to hypertension and severity of hand issue. Patient is aware and verbalizes understanding of plan of care.  Vitals:   04/21/22 1023 04/21/22 1026  BP: (!) 196/110 (!) 191/109  Pulse: 73   Resp: 20   Temp: (!) 97.4 F (36.3 C)   SpO2: 97%

## 2022-04-21 NOTE — ED Triage Notes (Signed)
C/o abscess to posterior L hand x 1 week.  States it started as a pimple and has gotten worse.  Sent from [facility].  Denies fever and chills.

## 2022-04-21 NOTE — Discharge Instructions (Signed)
Warm compresses at least 4 times a day.  Please call your family doctor or establish care with a family doctor.  There is information in this paperwork to call the hotline number and someone can help you find a physician.  Typically the wound will be reassessed within 48 hours this can be done here or at urgent care or a family doctor's office.  Please return for rapid spreading redness or if you develop a fever.

## 2022-04-21 NOTE — ED Provider Notes (Signed)
[facility]    CSN: [id number] Arrival date & time: 04/21/22  1002      History   Chief Complaint Chief Complaint  Patient presents with   Abscess    HPI Jerry [name] is a 76 y.o. male presenting with left hand swelling and redness for about 1 week.  History noncontributory.  He states exquisite pain over the hand and area of the abscess extending up into the wrist.  Denies known trauma to the hand.  There is some drainage from the area.  He is also found to have significantly elevated blood pressure, he states he does not take blood pressure medications as he does not like them.  He denies current headaches, vision changes, dizziness, weakness, chest pain, shortness of breath.  HPI  Past Medical History:  Diagnosis Date   Arthritis    Phimosis     There are no problems to display for this patient.   Past Surgical History:  Procedure Laterality Date   APPENDECTOMY  age 45   CIRCUMCISION N/A 12/08/2017   Procedure: CIRCUMCISION ADULT;  Surgeon: Hildred Laser, MD;  Location: [facility];  Service: Urology;  Laterality: N/A;   INGUINAL HERNIA REPAIR Bilateral 05/05/2015   Procedure: LAPAROSCOPIC BILATERAL INGUINAL HERNIA REPAIRS WITH MESH;  Surgeon: Axel Filler, MD;  Location: [facility];  Service: General;  Laterality: Bilateral;   INSERTION OF MESH Bilateral 05/05/2015   Procedure: INSERTION OF MESH;  Surgeon: Axel Filler, MD;  Location: [facility];  Service: General;  Laterality: Bilateral;       Home Medications    Prior to Admission medications   Medication Sig Start Date End Date Taking? Authorizing Provider  acetaminophen (TYLENOL) 325 MG tablet Take 650 mg by mouth every 6 (six) hours as needed.   Yes [provider]    Family History History reviewed. No pertinent family history.  Social History Social History   Tobacco Use   Smoking status: Never   Smokeless tobacco: Never  Vaping Use   Vaping Use: Never used   Substance Use Topics   Alcohol use: No    Alcohol/week: 0.0 standard drinks   Drug use: No     Allergies   Patient has no known allergies.   Review of Systems Review of Systems  Skin:  Positive for wound.  All other systems reviewed and are negative.   Physical Exam Triage Vital Signs ED Triage Vitals  Enc Vitals Group     BP 04/21/22 1023 (!) 196/110     Pulse Rate 04/21/22 1023 73     Resp 04/21/22 1023 20     Temp 04/21/22 1023 (!) 97.4 F (36.3 C)     Temp Source 04/21/22 1023 Oral     SpO2 04/21/22 1023 97 %     Weight --      Height --      Head Circumference --      Peak Flow --      Pain Score 04/21/22 1021 8     Pain Loc --      Pain Edu? --      Excl. in [place]? --    No data found.  Updated Vital Signs BP (!) 191/109 (BP Location: Right Arm) Comment (BP Location): repositioned  Pulse 73   Temp (!) 97.4 F (36.3 C) (Oral)   Resp 20   SpO2 97%   Visual Acuity Right Eye Distance:   Left Eye Distance:   Bilateral Distance:  Right Eye Near:   Left Eye Near:    Bilateral Near:     Physical Exam Vitals reviewed.  Constitutional:      General: He is not in acute distress.    Appearance: Normal appearance. He is not ill-appearing.  HENT:     Head: Normocephalic and atraumatic.  Pulmonary:     Effort: Pulmonary effort is normal.  Skin:    Comments: See image below 2.5cm abscess over the dorsal aspect of the L hand. There is some purulent drainage. There is 1+ swelling extending from the wrist through the fingers. No wrist or finger stiffness. Cap refill <2 seconds, radial pulse 2+.   Neurological:     General: No focal deficit present.     Mental Status: He is alert and oriented to person, place, and time.  Psychiatric:        Mood and Affect: Mood normal.        Behavior: Behavior normal.        Thought Content: Thought content normal.        Judgment: Judgment normal.      UC Treatments / Results  Labs (all labs ordered are listed,  but only abnormal results are displayed) Labs Reviewed - No data to display  EKG   Radiology No results found.  Procedures Procedures (including critical care time)  Medications Ordered in UC Medications - No data to display  Initial Impression / Assessment and Plan / UC Course  I have reviewed the triage vital signs and the nursing notes.  Pertinent labs & imaging results that were available during my care of the patient were reviewed by me and considered in my medical decision making (see chart for details).     This patient is a very pleasant 76 y.o. year old male presenting with L hand abscess and hypertensive urgency. No current headaches, vision changes, dizziness, SOB, CP. Does not take BP medications as he does not like them. I suspect he will require IV abx for the abscess given extent of swelling. Sent to ED via POV, he is in agreement.   Final Clinical Impressions(s) / UC Diagnoses   Final diagnoses:  Abscess of left hand  Hypertensive urgency     Discharge Instructions      -Sent to ED via POV, he is in agreement    ED Prescriptions   None    PDMP not reviewed this encounter.   Rhys Martini, PA-C 04/21/22 1122

## 2022-04-21 NOTE — Discharge Instructions (Addendum)
-  Sent to ED via POV, he is in agreement

## 2022-04-21 NOTE — ED Notes (Signed)
Pt verbalizes understanding of discharge instructions. Opportunity for questions and answers were provided. Pt discharged from the ED.   ?

## 2022-08-05 ENCOUNTER — Encounter (HOSPITAL_COMMUNITY): Payer: Self-pay

## 2022-08-05 ENCOUNTER — Ambulatory Visit (HOSPITAL_COMMUNITY)
Admission: EM | Admit: 2022-08-05 | Discharge: 2022-08-05 | Disposition: A | Discharge status: Home / Self Care | Payer: Medicare Other | Attending: Physician Assistant | Admitting: Physician Assistant

## 2022-08-05 DIAGNOSIS — I10 Essential (primary) hypertension: Secondary | ICD-10-CM | POA: Insufficient documentation

## 2022-08-05 DIAGNOSIS — L02416 Cutaneous abscess of left lower limb: Secondary | ICD-10-CM | POA: Insufficient documentation

## 2022-08-05 MED ORDER — CEFTRIAXONE SODIUM 1 G IJ SOLR
INTRAMUSCULAR | Status: AC
  Filled 2022-08-05: qty 10

## 2022-08-05 MED ORDER — DOXYCYCLINE HYCLATE 100 MG PO CAPS: 100.0000 mg | ORAL_CAPSULE | Freq: Two times a day (BID) | ORAL | 0 refills | Status: AC

## 2022-08-05 MED ORDER — LIDOCAINE HCL (PF) 1 % IJ SOLN
INTRAMUSCULAR | Status: AC
  Filled 2022-08-05: qty 2

## 2022-08-05 MED ORDER — CEFTRIAXONE SODIUM 1 G IJ SOLR
1.0000 g | Freq: Once | INTRAMUSCULAR | Status: AC
  Administered 2022-08-05: 1 g via INTRAMUSCULAR

## 2022-08-05 MED ORDER — MUPIROCIN 2 % EX OINT: 1.0000 | TOPICAL_OINTMENT | Freq: Two times a day (BID) | CUTANEOUS | 0 refills | Status: AC

## 2022-08-05 NOTE — ED Triage Notes (Signed)
Pt is here for a possible infection on left thigh x1wk

## 2022-08-05 NOTE — Discharge Instructions (Signed)
We gave the injection of antibiotics today.  Start doxycycline 100 mg twice daily for 10 days.  Stay out of the sun while on this medicine.  Keep the area clean and apply Bactroban ointment twice daily.  If your symptoms worsen in any way you need to go to the emergency room including if you develop fever, increased pain, nausea, vomiting.  Your blood pressure is very elevated.  I do think we should start medication.  Please follow-up.  Next week so we can recheck your blood pressure and start the medication if it has not improved.  Avoid NSAIDs (aspirin, ibuprofen/Advil, naproxen/Aleve), caffeine, sodium, decongestants.  If he develops any chest pain, shortness of breath, headache, vision change, dizziness in the setting of high blood pressure you need to go to the emergency room immediately.

## 2022-08-05 NOTE — ED Provider Notes (Signed)
Jerry [name]    CSN: [id number] Arrival date & time: 08/05/22  1327      History   Chief Complaint Chief Complaint  Patient presents with   Abscess    HPI Jerry [name] is a 76 y.o. male.   Patient presents today with 1 week history of enlarging lesion on his left thigh.  Reports that this is now draining purulent drainage.  He has been applying topical antibiotic ointment from a local pharmacy.  He has a history of recurrent skin infections and has had them drained recently on his hands in June 2023.  At that time he was treated with Bactrim DS.  Reports that they will go away temporarily only to recur.  He does have a history of recurrent skin infections including MRSA in 2017.  He denies additional antibiotics since that time.  Patient's blood pressure is very elevated.  He denies chest pain, shortness of breath, headache, vision change, dizziness.  He has not taken antihypertensive medications.  He has been evaluated in the past for elevated blood pressure but not started on medication as he is concerned about potential side effects.  He is not currently followed by PCP.  Reports that he was being seen by [facility] clinic but has not seen them recently.    Past Medical History:  Diagnosis Date   Arthritis    Phimosis     There are no problems to display for this patient.   Past Surgical History:  Procedure Laterality Date   APPENDECTOMY  age 64   CIRCUMCISION N/A 12/08/2017   Procedure: CIRCUMCISION ADULT;  Surgeon: Nickie Retort, MD;  Location: [facility];  Service: Urology;  Laterality: N/A;   INGUINAL HERNIA REPAIR Bilateral 05/05/2015   Procedure: LAPAROSCOPIC BILATERAL INGUINAL HERNIA REPAIRS WITH MESH;  Surgeon: Ralene Ok, MD;  Location: [facility];  Service: General;  Laterality: Bilateral;   INSERTION OF MESH Bilateral 05/05/2015   Procedure: INSERTION OF MESH;  Surgeon: Ralene Ok, MD;  Location: [facility];  Service: General;   Laterality: Bilateral;       Home Medications    Prior to Admission medications   Medication Sig Start Date End Date Taking? Authorizing Provider  doxycycline (VIBRAMYCIN) 100 MG capsule Take 1 capsule (100 mg total) by mouth 2 (two) times daily. 08/05/22  Yes Malayjah Otoole, Junie Panning K, PA-C  mupirocin ointment (BACTROBAN) 2 % Apply 1 Application topically 2 (two) times daily. 08/05/22  Yes Morrison Masser, Derry Skill, PA-C  acetaminophen (TYLENOL) 325 MG tablet Take 650 mg by mouth every 6 (six) hours as needed.    [provider]    Family History History reviewed. No pertinent family history.  Social History Social History   Tobacco Use   Smoking status: Never   Smokeless tobacco: Never  Vaping Use   Vaping Use: Never used  Substance Use Topics   Alcohol use: No    Alcohol/week: 0.0 standard drinks of alcohol   Drug use: No     Allergies   Patient has no known allergies.   Review of Systems Review of Systems  Constitutional:  Positive for activity change. Negative for appetite change, fatigue and fever.  Eyes:  Negative for visual disturbance.  Respiratory:  Negative for cough and shortness of breath.   Cardiovascular:  Negative for chest pain.  Gastrointestinal:  Negative for abdominal pain, diarrhea, nausea and vomiting.  Musculoskeletal:  Negative for arthralgias and myalgias.  Skin:  Positive for color change and wound.  Neurological:  Negative for dizziness, light-headedness and headaches.     Physical Exam Triage Vital Signs ED Triage Vitals  Enc Vitals Group     BP 08/05/22 1518 (!) 177/83     Pulse Rate 08/05/22 1518 81     Resp 08/05/22 1518 12     Temp 08/05/22 1518 99 F (37.2 C)     Temp Source 08/05/22 1518 Oral     SpO2 08/05/22 1518 98 %     Weight 08/05/22 1517 148 lb (67.1 kg)     Height 08/05/22 1517 5\' 8"  (1.727 m)     Head Circumference --      Peak Flow --      Pain Score 08/05/22 1517 8     Pain Loc --      Pain Edu? --      Excl. in [place]? --     No data found.  Updated Vital Signs BP (!) 177/83 (BP Location: Left Arm)   Pulse 81   Temp 99 F (37.2 C) (Oral)   Resp 12   Ht 5\' 8"  (1.727 m)   Wt 148 lb (67.1 kg)   SpO2 98%   BMI 22.50 kg/m   Visual Acuity Right Eye Distance:   Left Eye Distance:   Bilateral Distance:    Right Eye Near:   Left Eye Near:    Bilateral Near:     Physical Exam Vitals reviewed.  Constitutional:      General: He is awake.     Appearance: Normal appearance. He is well-developed. He is not ill-appearing.     Comments: Very pleasant male appears stated age in no acute distress sitting comfortably in exam room  HENT:     Head: Normocephalic and atraumatic.     Mouth/Throat:     Pharynx: No oropharyngeal exudate, posterior oropharyngeal erythema or uvula swelling.  Cardiovascular:     Rate and Rhythm: Normal rate and regular rhythm.     Heart sounds: Normal heart sounds, S1 normal and S2 normal. No murmur heard. Pulmonary:     Effort: Pulmonary effort is normal.     Breath sounds: Normal breath sounds. No stridor. No wheezing, rhonchi or rales.     Comments: Clear to auscultation bilaterally Skin:    Findings: Abscess present.          Comments: 2 cm x 1 cm nodule with active purulent drainage noted anterior/distal left thigh.  No streaking or evidence of lymphangitis.  Additional purulent drainage is expressed with palpation.  Neurological:     Mental Status: He is alert.  Psychiatric:        Behavior: Behavior is cooperative.      UC Treatments / Results  Labs (all labs ordered are listed, but only abnormal results are displayed) Labs Reviewed  AEROBIC CULTURE W GRAM STAIN (SUPERFICIAL SPECIMEN)    EKG   Radiology No results found.  Procedures Procedures (including critical care time)  Medications Ordered in UC Medications  cefTRIAXone (ROCEPHIN) injection 1 g (has no administration in time range)    Initial Impression / Assessment and Plan / UC Course  I  have reviewed the triage vital signs and the nursing notes.  Pertinent labs & imaging results that were available during my care of the patient were reviewed by me and considered in my medical decision making (see chart for details).     Discussed concern for MRSA particular given recurrent infections.  Culture was obtained since this has not been  done in several years to ensure sensitivities.  Patient was given 1 g of Rocephin in clinic and started on doxycycline.  Discussed that he is to avoid prolonged sun exposure with this medication due to photosensitivity.  He is to keep area clean with soap and water and apply Bactroban ointment.  We will contact him if we need to change his antibiotics per susceptibilities identified on culture.  ID was deferred given active drainage in clinic today without significant fluctuance on exam.  Discussed that if he has increased accumulation of fluid or pain he needs to go to the emergency room for further evaluation and management.  Discussed alarm symptoms that warrant emergent evaluation including fever, nausea, vomiting.  Strict return precautions given.  Blood pressure is very elevated today.  Patient has a history of very elevated blood pressure.  We discussed potential utility of starting antihypertensive medication but patient declined this since he will be taking antibiotics.  He is unsure who his primary care provider is which is why he comes to see [place].  Discussed that someone should recheck his blood pressure in approximately a week if he is unable to see PCP he can return here.  He is to avoid decongestants, NSAIDs, caffeine, sodium.  Discussed that if he develops any chest pain, shortness of breath, headache, vision change, dizziness in setting of high blood pressure he needs to go to the emergency room immediately.  If his blood pressure is elevated at his next visit we will need to initiate antihypertensive medications.  Final Clinical Impressions(s) /  UC Diagnoses   Final diagnoses:  Abscess of left thigh  Elevated blood pressure reading with diagnosis of hypertension     Discharge Instructions      We gave the injection of antibiotics today.  Start doxycycline 100 mg twice daily for 10 days.  Stay out of the sun while on this medicine.  Keep the area clean and apply Bactroban ointment twice daily.  If your symptoms worsen in any way you need to go to the emergency room including if you develop fever, increased pain, nausea, vomiting.  Your blood pressure is very elevated.  I do think we should start medication.  Please follow-up.  Next week so we can recheck your blood pressure and start the medication if it has not improved.  Avoid NSAIDs (aspirin, ibuprofen/Advil, naproxen/Aleve), caffeine, sodium, decongestants.  If he develops any chest pain, shortness of breath, headache, vision change, dizziness in the setting of high blood pressure you need to go to the emergency room immediately.     ED Prescriptions     Medication Sig Dispense Auth. Provider   doxycycline (VIBRAMYCIN) 100 MG capsule Take 1 capsule (100 mg total) by mouth 2 (two) times daily. 20 capsule Waino Mounsey K, PA-C   mupirocin ointment (BACTROBAN) 2 % Apply 1 Application topically 2 (two) times daily. 22 g Rhodie Cienfuegos K, PA-C      PDMP not reviewed this encounter.   Terrilee Croak, PA-C 08/05/22 1554

## 2022-08-06 LAB — AEROBIC CULTURE W GRAM STAIN (SUPERFICIAL SPECIMEN)

## 2022-08-08 LAB — AEROBIC CULTURE W GRAM STAIN (SUPERFICIAL SPECIMEN)

## 2022-08-29 DIAGNOSIS — Z23 Encounter for immunization: Secondary | ICD-10-CM | POA: Diagnosis not present

## 2023-06-09 IMAGING — CR DG KNEE COMPLETE 4+V*R*
4 series · 4 of 4 positions shown · non-contrast
Comparison: None.

CLINICAL DATA: Recent fall with knee pain, initial encounter

EXAM:
RIGHT KNEE - COMPLETE 4+ VIEW

[knee obl (1 of 2)]
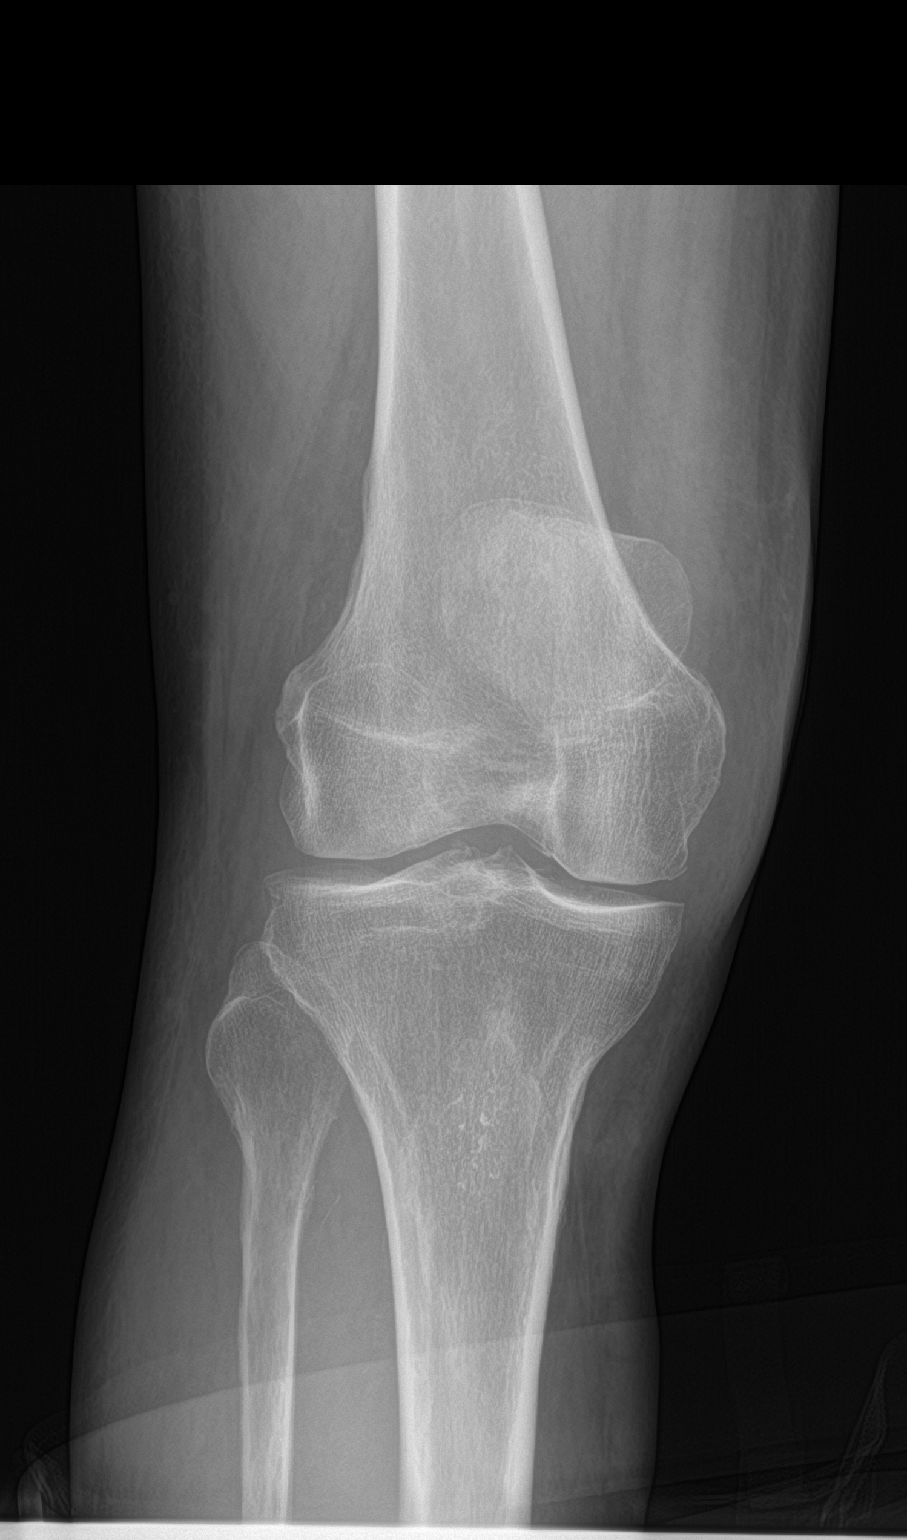

[knee obl (2 of 2)]
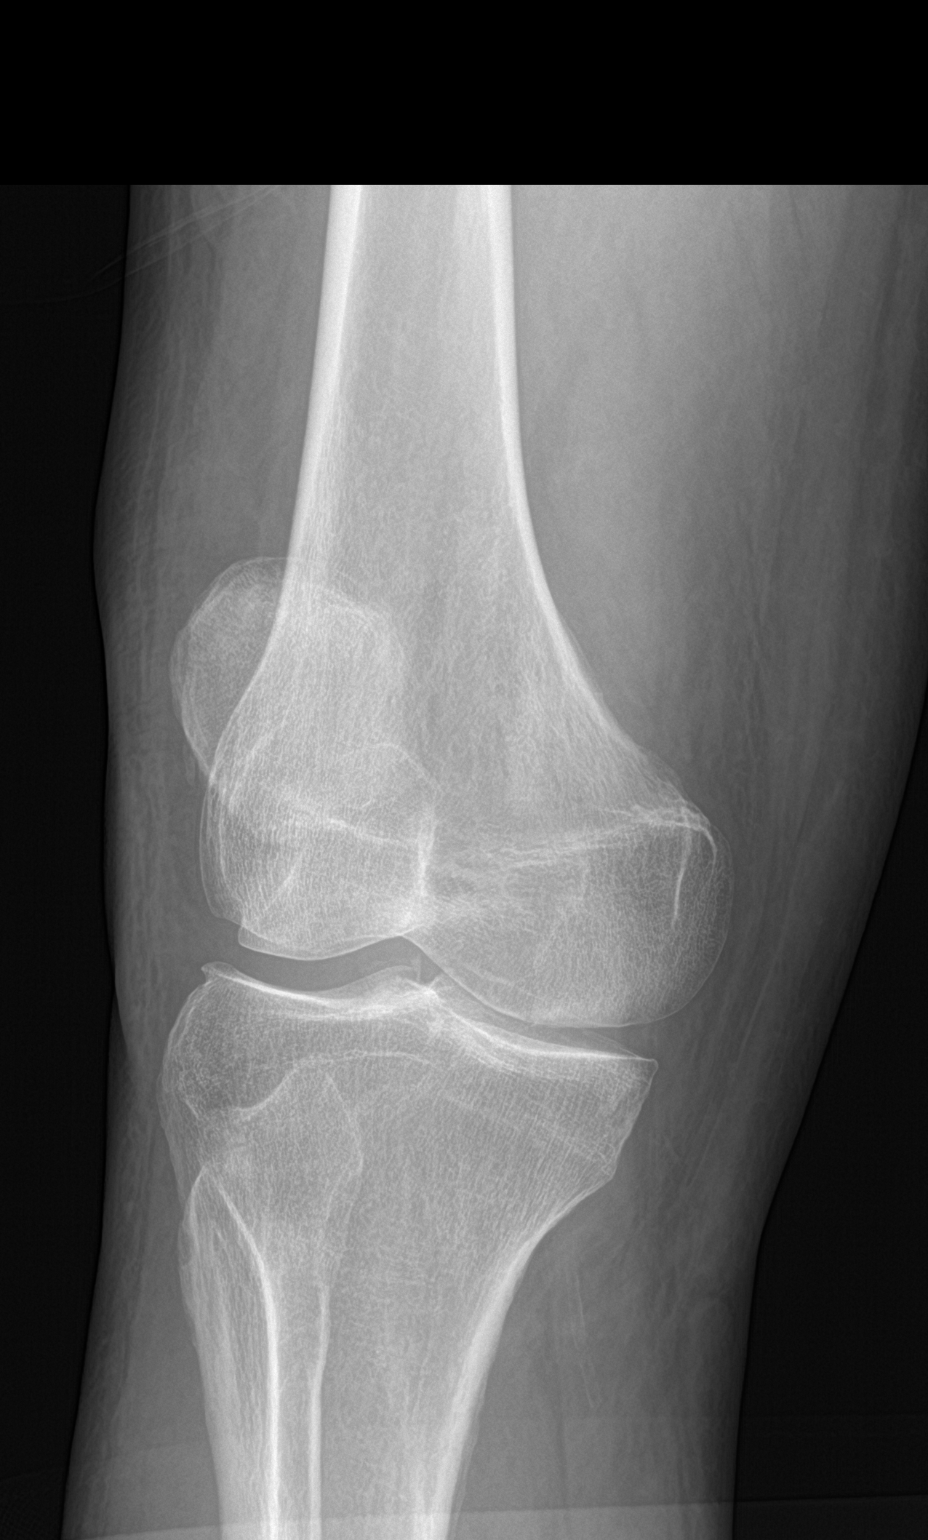

[knee ap]
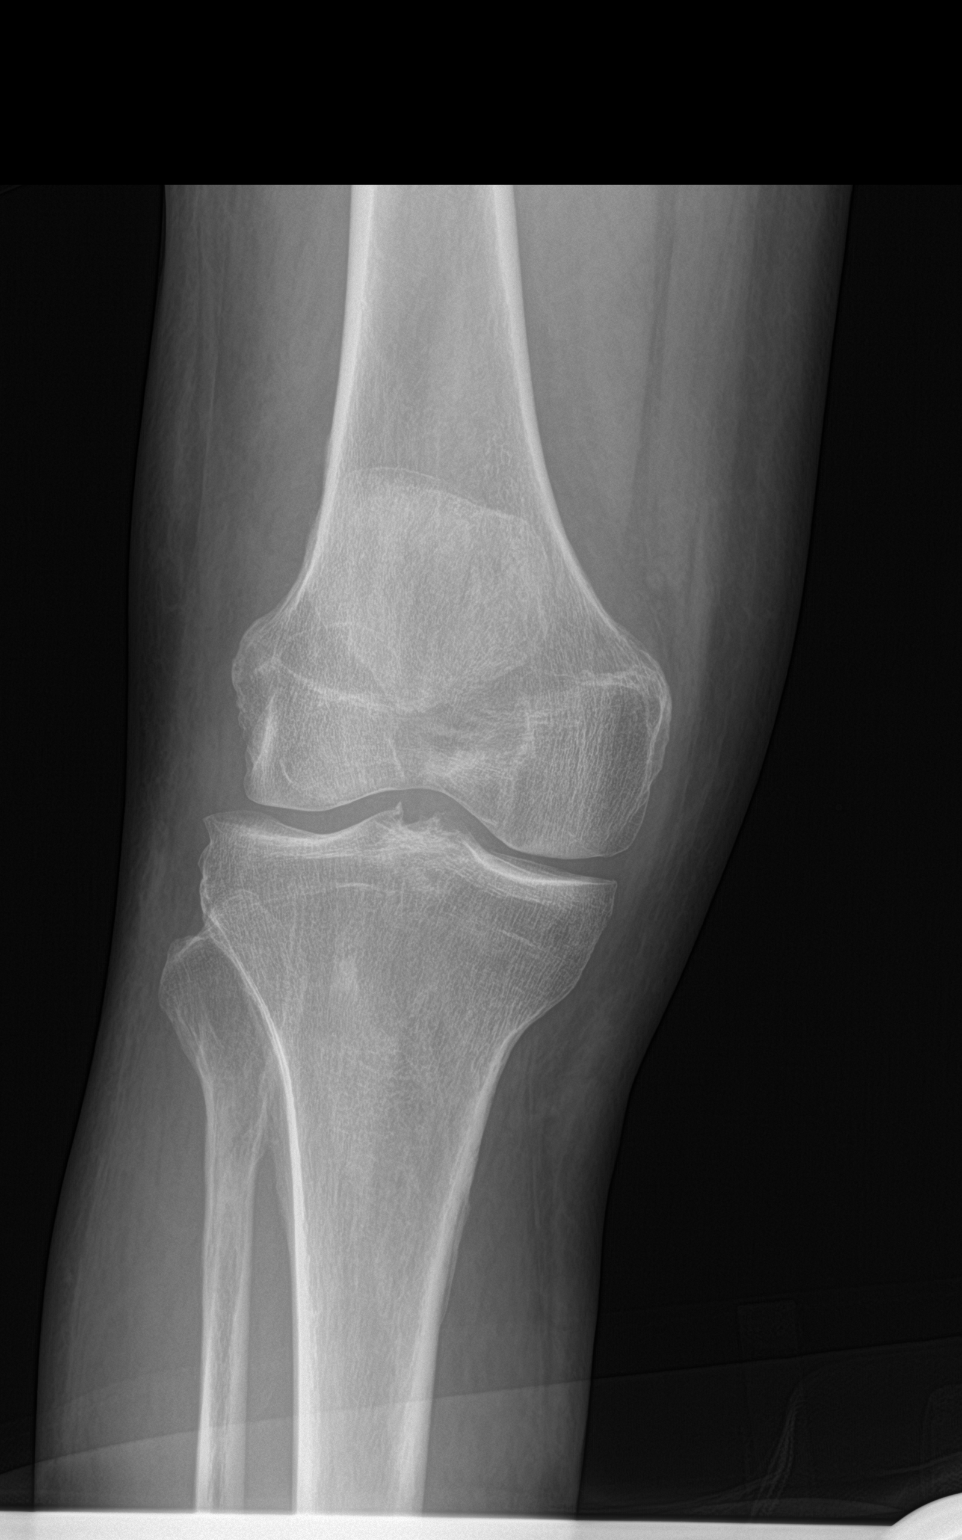

[knee lat]
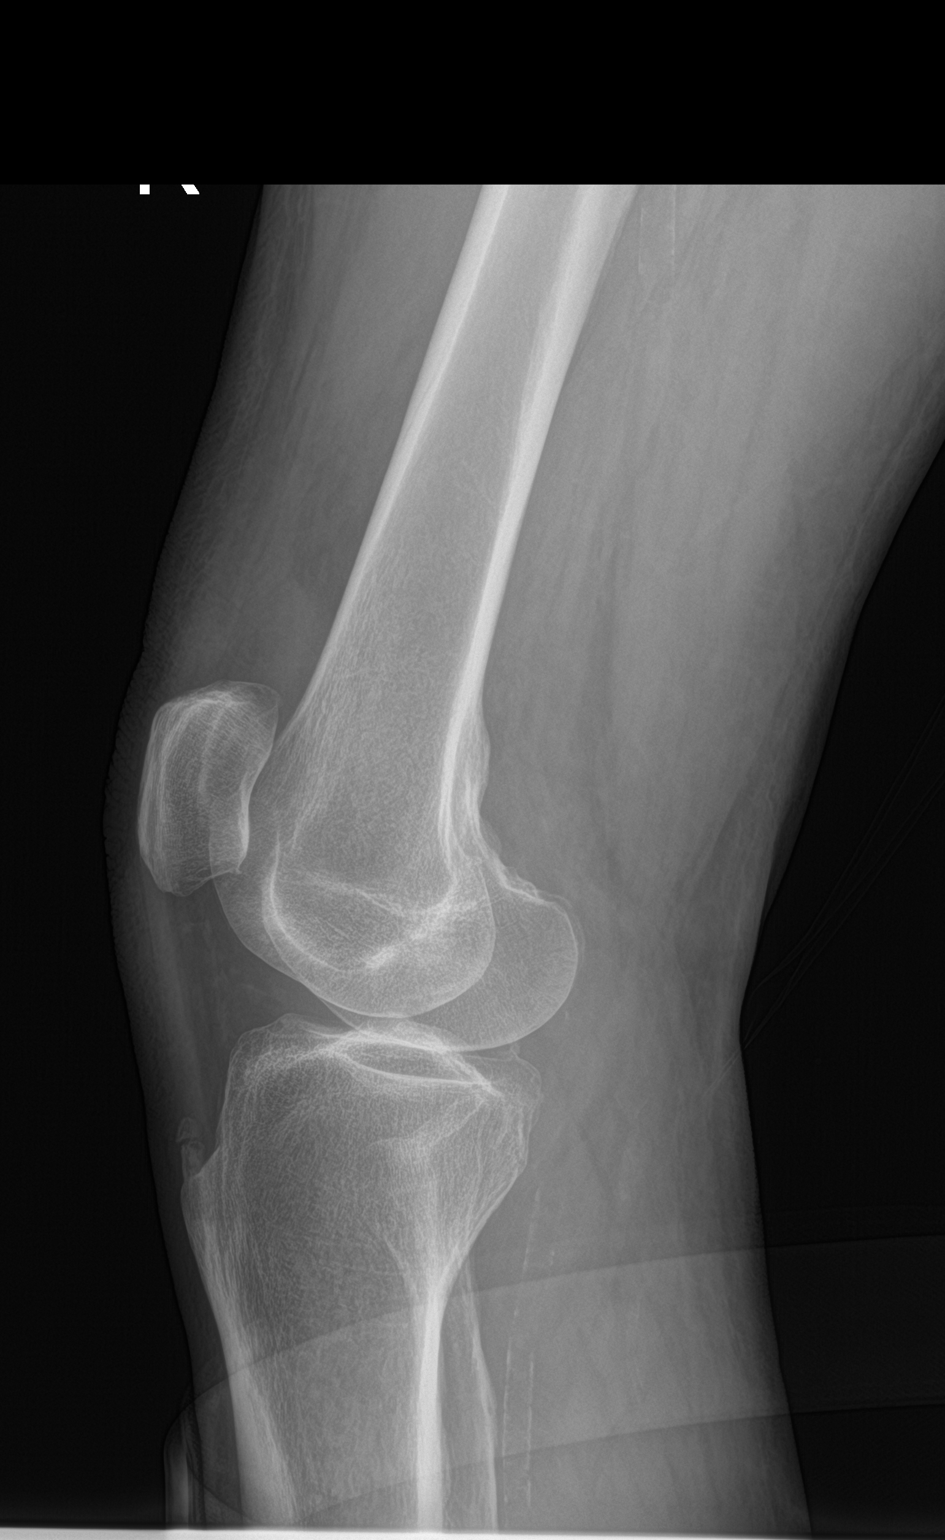

[4 of 4 positions shown; findings below may reference images not displayed]

FINDINGS: No acute fracture or dislocation is noted. No soft tissue
abnormality is seen.
IMPRESSION: No acute abnormality noted.

## 2023-08-31 DIAGNOSIS — Z23 Encounter for immunization: Secondary | ICD-10-CM | POA: Diagnosis not present

## 2024-09-26 DIAGNOSIS — Z23 Encounter for immunization: Secondary | ICD-10-CM | POA: Diagnosis not present
